# Patient Record
Sex: Female | Born: 1975 | Race: Black or African American | Hispanic: No | Marital: Single | Smoking: Never smoker
Health system: Southern US, Community
[De-identification: ages and names within clinical notes are randomized; demographics above are authoritative.]

## PROBLEM LIST (undated history)

## (undated) DIAGNOSIS — H9190 Unspecified hearing loss, unspecified ear: Secondary | ICD-10-CM

## (undated) DIAGNOSIS — Z9889 Other specified postprocedural states: Secondary | ICD-10-CM

## (undated) DIAGNOSIS — J329 Chronic sinusitis, unspecified: Secondary | ICD-10-CM

## (undated) DIAGNOSIS — R011 Cardiac murmur, unspecified: Secondary | ICD-10-CM

## (undated) DIAGNOSIS — Z86018 Personal history of other benign neoplasm: Secondary | ICD-10-CM

## (undated) DIAGNOSIS — B009 Herpesviral infection, unspecified: Secondary | ICD-10-CM

## (undated) HISTORY — DX: Herpesviral infection, unspecified: B00.9

## (undated) HISTORY — PX: UTERINE FIBROID SURGERY: SHX826

## (undated) HISTORY — DX: Personal history of other benign neoplasm: Z86.018

## (undated) HISTORY — PX: BRAIN SURGERY: SHX531

## (undated) HISTORY — DX: Cardiac murmur, unspecified: R01.1

## (undated) HISTORY — DX: Other specified postprocedural states: Z98.890

---

## 2008-02-15 DIAGNOSIS — Z86018 Personal history of other benign neoplasm: Secondary | ICD-10-CM

## 2008-02-15 DIAGNOSIS — Z9889 Other specified postprocedural states: Secondary | ICD-10-CM

## 2008-02-15 HISTORY — DX: Personal history of other benign neoplasm: Z98.890

## 2008-02-15 HISTORY — DX: Other specified postprocedural states: Z86.018

## 2014-07-12 ENCOUNTER — Ambulatory Visit (INDEPENDENT_AMBULATORY_CARE_PROVIDER_SITE_OTHER): Payer: Managed Care, Other (non HMO)

## 2014-07-12 ENCOUNTER — Ambulatory Visit (INDEPENDENT_AMBULATORY_CARE_PROVIDER_SITE_OTHER): Payer: Managed Care, Other (non HMO) | Admitting: Family Medicine

## 2014-07-12 VITALS — BP 120/70 | HR 64 | Temp 98.5°F | Resp 18 | Ht 63.0 in | Wt 152.0 lb

## 2014-07-12 DIAGNOSIS — M940 Chondrocostal junction syndrome [Tietze]: Secondary | ICD-10-CM | POA: Diagnosis not present

## 2014-07-12 DIAGNOSIS — Z86018 Personal history of other benign neoplasm: Secondary | ICD-10-CM

## 2014-07-12 DIAGNOSIS — R079 Chest pain, unspecified: Secondary | ICD-10-CM

## 2014-07-12 DIAGNOSIS — Z9889 Other specified postprocedural states: Secondary | ICD-10-CM

## 2014-07-12 MED ORDER — DICLOFENAC SODIUM 75 MG PO TBEC: 75.0000 mg | DELAYED_RELEASE_TABLET | Freq: Two times a day (BID) | ORAL | Status: DC

## 2014-07-12 NOTE — Patient Instructions (Addendum)
This is most consistent with a chest wall pain  Take diclofenac one twice daily for pain and inflammation  Return if worse  Referral is being made to a neurosurgeon to follow-up on your acoustic neuroma surgery.  Costochondritis Costochondritis, sometimes called Tietze syndrome, is a swelling and irritation (inflammation) of the tissue (cartilage) that connects your ribs with your breastbone (sternum). It causes pain in the chest and rib area. Costochondritis usually goes away on its own over time. It can take up to 6 weeks or longer to get better, especially if you are unable to limit your activities. CAUSES  Some cases of costochondritis have no known cause. Possible causes include:  Injury (trauma).  Exercise or activity such as lifting.  Severe coughing. SIGNS AND SYMPTOMS  Pain and tenderness in the chest and rib area.  Pain that gets worse when coughing or taking deep breaths.  Pain that gets worse with specific movements. DIAGNOSIS  Your health care provider will do a physical exam and ask about your symptoms. Chest X-rays or other tests may be done to rule out other problems. TREATMENT  Costochondritis usually goes away on its own over time. Your health care provider may prescribe medicine to help relieve pain. HOME CARE INSTRUCTIONS   Avoid exhausting physical activity. Try not to strain your ribs during normal activity. This would include any activities using chest, abdominal, and side muscles, especially if heavy weights are used.  Apply ice to the affected area for the first 2 days after the pain begins.  Put ice in a plastic bag.  Place a towel between your skin and the bag.  Leave the ice on for 20 minutes, 2-3 times a day.  Only take over-the-counter or prescription medicines as directed by your health care provider. SEEK MEDICAL CARE IF:  You have redness or swelling at the rib joints. These are signs of infection.  Your pain does not go away despite rest  or medicine. SEEK IMMEDIATE MEDICAL CARE IF:   Your pain increases or you are very uncomfortable.  You have shortness of breath or difficulty breathing.  You cough up blood.  You have worse chest pains, sweating, or vomiting.  You have a fever or persistent symptoms for more than 2-3 days.  You have a fever and your symptoms suddenly get worse. MAKE SURE YOU:   Understand these instructions.  Will watch your condition.  Will get help right away if you are not doing well or get worse. Document Released: 11/10/2004 Document Revised: 11/21/2012 Document Reviewed: 09/04/2012 [facility] Patient Information 2015 [place], [place]. This information is not intended to replace advice given to you by your health care provider. Make sure you discuss any questions you have with your health care provider.

## 2014-07-12 NOTE — Progress Notes (Signed)
Subjective:  Patient ID: Tina Henson, female    DOB: 04-Aug-1975  Age: 39 y.o. MRN: [id number]  39 year old lady who moved here about 6 months ago from [place], originally from the [place]. She has a history of having had substernal chest pain except and was pressing on her chest the last 2 nights. It hurt when she turned over or twisted in bed. She had not been doing any undue physical activity of late. She has a Network engineer job. No specific trauma. It bothering her enough that she was concerned about it and wanted it checked out before she made to drive to [place]. She has been having some nausea. No vomiting. She has history of uterine fibroids and pains from that. 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 She is supposed to get every 6 months MRIs, was followed by a team of doctors in [place], primarily neurosurgery and ENT/audiology.   Objective:   Pleasant alert lady in no major distress. Neck supple and nontender. Chest is clear to auscultation. Heart regular without murmurs gallops or arrhythmias. Chest wall is tender in the sternal and border areas. She also has some tenderness in the epigastrium and in the suprapubic area of the abdomen.  [facility] reading (PRIMARY) by  Dr. Linna Darner Normal chest.    EKG normal    Assessment & Plan:   Assessment: Sternal chest pain Nausea History of fibroids of uterus History of left acoustic neuroma, with residual deafness  Plan: Patient Instructions  This is most consistent with a chest wall pain  Take diclofenac one twice daily for pain and inflammation  Return if worse  Referral is being made to a neurosurgeon to follow-up on your acoustic neuroma surgery.  Costochondritis Costochondritis, sometimes called Tietze syndrome, is a swelling and irritation (inflammation) of the tissue (cartilage) that connects your ribs with your breastbone (sternum). It causes pain in the chest and rib area.  Costochondritis usually goes away on its own over time. It can take up to 6 weeks or longer to get better, especially if you are unable to limit your activities. CAUSES  Some cases of costochondritis have no known cause. Possible causes include:  Injury (trauma).  Exercise or activity such as lifting.  Severe coughing. SIGNS AND SYMPTOMS  Pain and tenderness in the chest and rib area.  Pain that gets worse when coughing or taking deep breaths.  Pain that gets worse with specific movements. DIAGNOSIS  Your health care provider will do a physical exam and ask about your symptoms. Chest X-rays or other tests may be done to rule out other problems. TREATMENT  Costochondritis usually goes away on its own over time. Your health care provider may prescribe medicine to help relieve pain. HOME CARE INSTRUCTIONS   Avoid exhausting physical activity. Try not to strain your ribs during normal activity. This would include any activities using chest, abdominal, and side muscles, especially if heavy weights are used.  Apply ice to the affected area for the first 2 days after the pain begins.  Put ice in a plastic bag.  Place a towel between your skin and the bag.  Leave the ice on for 20 minutes, 2-3 times a day.  Only take over-the-counter or prescription medicines as directed by your health care provider. SEEK MEDICAL CARE IF:  You have redness or swelling at the rib joints. These are signs of infection.  Your pain does not go away despite rest or medicine. SEEK  IMMEDIATE MEDICAL CARE IF:   Your pain increases or you are very uncomfortable.  You have shortness of breath or difficulty breathing.  You cough up blood.  You have worse chest pains, sweating, or vomiting.  You have a fever or persistent symptoms for more than 2-3 days.  You have a fever and your symptoms suddenly get worse. MAKE SURE YOU:   Understand these instructions.  Will watch your condition.  Will get  help right away if you are not doing well or get worse. Document Released: 11/10/2004 Document Revised: 11/21/2012 Document Reviewed: 09/04/2012 [facility] Patient Information 2015 [place], [place]. This information is not intended to replace advice given to you by your health care provider. Make sure you discuss any questions you have with your health care provider.      Jordy Hewins, MD 07/12/2014

## 2014-08-25 ENCOUNTER — Other Ambulatory Visit (HOSPITAL_COMMUNITY)
Admission: RE | Admit: 2014-08-25 | Discharge: 2014-08-25 | Disposition: A | Discharge status: Home / Self Care | Payer: Managed Care, Other (non HMO) | Source: Ambulatory Visit | Attending: Obstetrics & Gynecology | Admitting: Obstetrics & Gynecology

## 2014-08-25 ENCOUNTER — Other Ambulatory Visit: Payer: Self-pay | Admitting: Obstetrics & Gynecology

## 2014-08-25 DIAGNOSIS — Z113 Encounter for screening for infections with a predominantly sexual mode of transmission: Secondary | ICD-10-CM | POA: Diagnosis present

## 2014-08-25 DIAGNOSIS — Z01419 Encounter for gynecological examination (general) (routine) without abnormal findings: Secondary | ICD-10-CM | POA: Diagnosis present

## 2014-08-25 DIAGNOSIS — Z1151 Encounter for screening for human papillomavirus (HPV): Secondary | ICD-10-CM | POA: Diagnosis present

## 2014-08-26 LAB — CYTOLOGY - PAP

## 2014-10-23 DIAGNOSIS — Z86018 Personal history of other benign neoplasm: Secondary | ICD-10-CM | POA: Insufficient documentation

## 2014-10-23 DIAGNOSIS — Z9889 Other specified postprocedural states: Secondary | ICD-10-CM

## 2015-01-03 ENCOUNTER — Encounter (HOSPITAL_COMMUNITY): Payer: Self-pay | Admitting: Emergency Medicine

## 2015-01-03 ENCOUNTER — Emergency Department (HOSPITAL_COMMUNITY): Payer: Managed Care, Other (non HMO)

## 2015-01-03 ENCOUNTER — Emergency Department (HOSPITAL_COMMUNITY)
Admission: EM | Admit: 2015-01-03 | Discharge: 2015-01-03 | Disposition: A | Discharge status: Home / Self Care | Payer: Managed Care, Other (non HMO) | Attending: Emergency Medicine | Admitting: Emergency Medicine

## 2015-01-03 DIAGNOSIS — R1031 Right lower quadrant pain: Secondary | ICD-10-CM

## 2015-01-03 DIAGNOSIS — R1084 Generalized abdominal pain: Secondary | ICD-10-CM

## 2015-01-03 DIAGNOSIS — H9190 Unspecified hearing loss, unspecified ear: Secondary | ICD-10-CM | POA: Diagnosis not present

## 2015-01-03 DIAGNOSIS — Z8709 Personal history of other diseases of the respiratory system: Secondary | ICD-10-CM | POA: Diagnosis not present

## 2015-01-03 DIAGNOSIS — R112 Nausea with vomiting, unspecified: Secondary | ICD-10-CM | POA: Diagnosis not present

## 2015-01-03 DIAGNOSIS — Z3202 Encounter for pregnancy test, result negative: Secondary | ICD-10-CM | POA: Diagnosis not present

## 2015-01-03 DIAGNOSIS — R109 Unspecified abdominal pain: Secondary | ICD-10-CM | POA: Diagnosis present

## 2015-01-03 HISTORY — DX: Chronic sinusitis, unspecified: J32.9

## 2015-01-03 HISTORY — DX: Unspecified hearing loss, unspecified ear: H91.90

## 2015-01-03 LAB — URINALYSIS, ROUTINE W REFLEX MICROSCOPIC
BILIRUBIN URINE: NEGATIVE
GLUCOSE, UA: NEGATIVE mg/dL
HGB URINE DIPSTICK: NEGATIVE
KETONES UR: NEGATIVE mg/dL
Leukocytes, UA: NEGATIVE
NITRITE: NEGATIVE
PH: 5.5 (ref 5.0–8.0)
Protein, ur: NEGATIVE mg/dL
Specific Gravity, Urine: 1.018 (ref 1.005–1.030)

## 2015-01-03 LAB — BASIC METABOLIC PANEL
Anion gap: 8 (ref 5–15)
BUN: 8 mg/dL (ref 6–20)
CALCIUM: 9.1 mg/dL (ref 8.9–10.3)
CO2: 25 mmol/L (ref 22–32)
CREATININE: 0.7 mg/dL (ref 0.44–1.00)
Chloride: 107 mmol/L (ref 101–111)
GFR calc non Af Amer: >60 mL/min (ref >60)
GLUCOSE: 92 mg/dL (ref 65–99)
Potassium: 4.3 mmol/L (ref 3.5–5.1)
Sodium: 140 mmol/L (ref 135–145)

## 2015-01-03 LAB — CBC WITH DIFFERENTIAL/PLATELET
Basophils Absolute: 0 10*3/uL (ref 0.0–0.1)
Basophils Relative: 0 %
Eosinophils Absolute: 0 10*3/uL (ref 0.0–0.7)
Eosinophils Relative: 1 %
HEMATOCRIT: 37.8 % (ref 36.0–46.0)
Hemoglobin: 12.6 g/dL (ref 12.0–15.0)
LYMPHS ABS: 1.9 10*3/uL (ref 0.7–4.0)
LYMPHS PCT: 44 %
MCH: 31.3 pg (ref 26.0–34.0)
MCHC: 33.3 g/dL (ref 30.0–36.0)
MCV: 93.8 fL (ref 78.0–100.0)
MONO ABS: 0.3 10*3/uL (ref 0.1–1.0)
MONOS PCT: 7 %
NEUTROS ABS: 2.1 10*3/uL (ref 1.7–7.7)
Neutrophils Relative %: 48 %
Platelets: 204 10*3/uL (ref 150–400)
RBC: 4.03 MIL/uL (ref 3.87–5.11)
RDW: 13 % (ref 11.5–15.5)
WBC: 4.3 10*3/uL (ref 4.0–10.5)

## 2015-01-03 LAB — I-STAT BETA HCG BLOOD, ED (MC, WL, AP ONLY): I-stat hCG, quantitative: <5 m[IU]/mL (ref <5)

## 2015-01-03 MED ORDER — SODIUM CHLORIDE 0.9 % IV BOLUS (SEPSIS)
1000.0000 mL | Freq: Once | INTRAVENOUS | Status: AC
  Administered 2015-01-03: 1000 mL via INTRAVENOUS

## 2015-01-03 MED ORDER — ONDANSETRON 4 MG PO TBDP: ORAL_TABLET | ORAL | Status: DC

## 2015-01-03 NOTE — ED Notes (Signed)
Pt ambulated to BR with steady gait.

## 2015-01-03 NOTE — ED Notes (Signed)
Pt taken to CT scan and returned without distress noted.

## 2015-01-03 NOTE — ED Notes (Signed)
Awake. Verbally responsive. A/O x4. Resp even and unlabored. No audible adventitious breath sounds noted. ABC's intact. IV saline lock patent and intact. 

## 2015-01-03 NOTE — Discharge Instructions (Signed)

## 2015-01-03 NOTE — ED Notes (Signed)
Awake. Verbally responsive. A/O x4. Resp even and unlabored. No audible adventitious breath sounds noted. ABC's intact.  

## 2015-01-03 NOTE — ED Provider Notes (Signed)
CSN: [id number]     Arrival date & time 01/03/15  0740 History   First MD Initiated Contact with Patient 01/03/15 [phone]     Chief Complaint  Patient presents with  . Abdominal Pain     (Consider location/radiation/quality/duration/timing/severity/associated sxs/prior Treatment) Patient is a 39 y.o. female presenting with flank pain.  Flank Pain This is a new problem. Episode onset: 2 weeks ago. The problem occurs constantly. The problem has not changed since onset.Associated symptoms include abdominal pain. Pertinent negatives include no chest pain, no headaches and no shortness of breath. Nothing aggravates the symptoms. Nothing relieves the symptoms. She has tried nothing for the symptoms.    Past Medical History  Diagnosis Date  . Sinusitis   . Hearing loss    Past Surgical History  Procedure Laterality Date  . Brain surgery    . Uterine fibroid surgery     Family History  Problem Relation Age of Onset  . Cancer Mother   . Diabetes Mother   . Stroke Father    Social History  Substance Use Topics  . Smoking status: Never Smoker   . Smokeless tobacco: None  . Alcohol Use: 0.0 oz/week    0 Standard drinks or equivalent per week     Comment: socially   OB History    No data available     Review of Systems  Respiratory: Negative for shortness of breath.   Cardiovascular: Negative for chest pain.  Gastrointestinal: Positive for abdominal pain.  Genitourinary: Positive for flank pain.  Neurological: Negative for headaches.  All other systems reviewed and are negative.     Allergies  Review of patient's allergies indicates no known allergies.  Home Medications   Prior to Admission medications   Medication Sig Start Date End Date Taking? Authorizing Provider  aspirin 500 MG tablet Take 1,000 mg by mouth every 6 (six) hours as needed for pain.   Yes Historical Provider, MD  naproxen sodium (ANAPROX) 220 MG tablet Take 440 mg by mouth 2 (two) times daily as  needed (for pain).   Yes Historical Provider, MD  valACYclovir (VALTREX) 1000 MG tablet Take 1,000 mg by mouth daily as needed (for herpes).   Yes Historical Provider, MD  ondansetron (ZOFRAN ODT) 4 MG disintegrating tablet 4mg  ODT q4 hours prn nausea/vomit 01/03/15   Debby Freiberg, MD   BP 104/74 mmHg  Pulse 81  Temp(Src) 97.7 F (36.5 C) (Oral)  Resp 18  Ht 5\' 2"  (1.575 m)  Wt 150 lb (68.04 kg)  BMI 27.43 kg/m2  SpO2 100%  LMP 12/08/2014 Physical Exam  Constitutional: She is oriented to person, place, and time. She appears well-developed and well-nourished.  HENT:  Head: Normocephalic and atraumatic.  Right Ear: External ear normal.  Left Ear: External ear normal.  Eyes: Conjunctivae and EOM are normal. Pupils are equal, round, and reactive to light.  Neck: Normal range of motion. Neck supple.  Cardiovascular: Normal rate, regular rhythm, normal heart sounds and intact distal pulses.   Pulmonary/Chest: Effort normal and breath sounds normal.  Abdominal: Soft. Bowel sounds are normal. There is tenderness in the right upper quadrant and right lower quadrant. There is no CVA tenderness.  Musculoskeletal: Normal range of motion.  Neurological: She is alert and oriented to person, place, and time.  Skin: Skin is warm and dry.  Vitals reviewed.   ED Course  Procedures (including critical care time) Labs Review Labs Reviewed  URINALYSIS, ROUTINE W REFLEX MICROSCOPIC (NOT AT [facility]) - Abnormal;  Notable for the following:    APPearance CLOUDY (*)    All other components within normal limits  URINE CULTURE  CBC WITH DIFFERENTIAL/PLATELET  BASIC METABOLIC PANEL  I-STAT BETA HCG BLOOD, ED ([facility], [facility], [facility] ONLY)    Imaging Review Ct Abdomen Pelvis Wo Contrast  01/03/2015  CLINICAL DATA:  Pt reported having intermittent RLQ pain. Pt took an expired ABT from [place] without relief. Pt reported burning with voiding, urinary frequency/urgency, dk amber color and no foul odor or hematuria. Pt  reported constant nausea but denies vomiting. No fever. EXAM: CT ABDOMEN AND PELVIS WITHOUT CONTRAST TECHNIQUE: Multidetector CT imaging of the abdomen and pelvis was performed following the standard protocol without IV contrast. COMPARISON:  None. FINDINGS: Lung bases:  Clear.  Heart normal size. Liver: Multiple small calcifications are noted in the superior aspect of the right lobe, nonspecific. These may reflect healed granuloma. Liver otherwise unremarkable. Spleen, gallbladder, pancreas, adrenal glands:  Normal. Kidneys, ureters, bladder: Normal. No renal or ureteral stones. No hydronephrosis. Uterus and adnexa: Prominent right ovary, measuring 4.5 x 3.7 x 4.2 cm. Adnexa otherwise unremarkable. Lymph nodes:  No adenopathy. Ascites:  None. Gastrointestinal: Stomach, small bowel and colon are unremarkable. Normal appendix visualized. Abdominal wall: Small fat containing umbilical hernia. Musculoskeletal:  Normal. IMPRESSION: 1. No acute findings. 2. No renal or ureteral stones or obstructive uropathy. 3. Right ovary is mildly prominent. This is likely from a physiologic cyst. However, the patient's pain correlates to the right adnexa, follow-up pelvic ultrasound with Doppler imaging may be helpful to exclude torsion. There are no CT findings to suggest torsion, however. 4. Small calcifications along the dome of the right lobe of the liver, nonspecific. These are presumed to be a chronic incidental finding. 5. Exam otherwise unremarkable.  Normal appendix visualized. Electronically Signed   By: Lajean Manes M.D.   On: 01/03/2015 09:22   I have personally reviewed and evaluated these images and lab results as part of my medical decision-making.   EKG Interpretation None      MDM   Final diagnoses:  Generalized abdominal pain    39 y.o. female without pertinent PMH presents with R flank pain, intermittent but also constantly present x 2 weeks.  +nausea, - vomiting.  Physical exam as above with  primarily RLQ abd tenderness.  Otherwise benign exam and pt is well appearing.    Wu unremarkable.  Likely constipation mediated, however also possibly due to cyst.  Petra Kuba of history makes ovarian torsion unlikely.  DC home to fu with gyn with strict return precautions.  I have reviewed all laboratory and imaging studies if ordered as above  1. Generalized abdominal pain   2. RLQ abdominal pain         Debby Freiberg, MD 01/04/15 0700

## 2015-01-03 NOTE — ED Notes (Addendum)
Pt reported having intermittent RLQ pain. Pt took an expired ABT from [place] without relief.  Pt reported burning with voiding, urinary frequency/urgency, dk amber color and no foul odor or hematuria. Pt reported constant nausea but denies emesis. No fever.

## 2015-01-05 LAB — URINE CULTURE: Culture: 30000

## 2015-07-04 ENCOUNTER — Ambulatory Visit (INDEPENDENT_AMBULATORY_CARE_PROVIDER_SITE_OTHER): Payer: Managed Care, Other (non HMO) | Admitting: Physician Assistant

## 2015-07-04 VITALS — BP 120/78 | HR 87 | Temp 98.4°F | Resp 16 | Ht 62.5 in | Wt 158.0 lb

## 2015-07-04 DIAGNOSIS — Z113 Encounter for screening for infections with a predominantly sexual mode of transmission: Secondary | ICD-10-CM | POA: Diagnosis not present

## 2015-07-04 DIAGNOSIS — Z1159 Encounter for screening for other viral diseases: Secondary | ICD-10-CM

## 2015-07-04 DIAGNOSIS — Z1329 Encounter for screening for other suspected endocrine disorder: Secondary | ICD-10-CM | POA: Diagnosis not present

## 2015-07-04 DIAGNOSIS — Z114 Encounter for screening for human immunodeficiency virus [HIV]: Secondary | ICD-10-CM

## 2015-07-04 DIAGNOSIS — N898 Other specified noninflammatory disorders of vagina: Secondary | ICD-10-CM | POA: Diagnosis not present

## 2015-07-04 DIAGNOSIS — N926 Irregular menstruation, unspecified: Secondary | ICD-10-CM

## 2015-07-04 DIAGNOSIS — Z0000 Encounter for general adult medical examination without abnormal findings: Secondary | ICD-10-CM

## 2015-07-04 DIAGNOSIS — Z13220 Encounter for screening for lipoid disorders: Secondary | ICD-10-CM | POA: Diagnosis not present

## 2015-07-04 DIAGNOSIS — A6000 Herpesviral infection of urogenital system, unspecified: Secondary | ICD-10-CM

## 2015-07-04 DIAGNOSIS — Z13228 Encounter for screening for other metabolic disorders: Secondary | ICD-10-CM

## 2015-07-04 DIAGNOSIS — R350 Frequency of micturition: Secondary | ICD-10-CM

## 2015-07-04 DIAGNOSIS — Z1231 Encounter for screening mammogram for malignant neoplasm of breast: Secondary | ICD-10-CM | POA: Diagnosis not present

## 2015-07-04 DIAGNOSIS — Z1321 Encounter for screening for nutritional disorder: Secondary | ICD-10-CM | POA: Diagnosis not present

## 2015-07-04 LAB — COMPLETE METABOLIC PANEL WITH GFR
ALT: 14 U/L (ref 6–29)
AST: 17 U/L (ref 10–30)
Albumin: 4.4 g/dL (ref 3.6–5.1)
Alkaline Phosphatase: 43 U/L (ref 33–115)
BUN: 12 mg/dL (ref 7–25)
CALCIUM: 9.3 mg/dL (ref 8.6–10.2)
CHLORIDE: 106 mmol/L (ref 98–110)
CO2: 28 mmol/L (ref 20–31)
Creat: 0.73 mg/dL (ref 0.50–1.10)
GFR, Est Non African American: >89 mL/min (ref >=60)
Glucose, Bld: 88 mg/dL (ref 65–99)
POTASSIUM: 4.7 mmol/L (ref 3.5–5.3)
Sodium: 141 mmol/L (ref 135–146)
Total Bilirubin: 0.4 mg/dL (ref 0.2–1.2)
Total Protein: 7.1 g/dL (ref 6.1–8.1)

## 2015-07-04 LAB — CBC WITH DIFFERENTIAL/PLATELET
Basophils Absolute: 40 cells/uL (ref 0–200)
Basophils Relative: 1 %
EOS ABS: 40 {cells}/uL (ref 15–500)
Eosinophils Relative: 1 %
HEMATOCRIT: 39.8 % (ref 35.0–45.0)
Hemoglobin: 13.4 g/dL (ref 11.7–15.5)
LYMPHS PCT: 41 %
Lymphs Abs: 1640 cells/uL (ref 850–3900)
MCH: 32 pg (ref 27.0–33.0)
MCHC: 33.7 g/dL (ref 32.0–36.0)
MCV: 95 fL (ref 80.0–100.0)
MONO ABS: 280 {cells}/uL (ref 200–950)
MONOS PCT: 7 %
MPV: 10 fL (ref 7.5–12.5)
NEUTROS PCT: 50 %
Neutro Abs: 2000 cells/uL (ref 1500–7800)
PLATELETS: 275 10*3/uL (ref 140–400)
RBC: 4.19 MIL/uL (ref 3.80–5.10)
RDW: 14.4 % (ref 11.0–15.0)
WBC: 4 10*3/uL (ref 3.8–10.8)

## 2015-07-04 LAB — POC MICROSCOPIC URINALYSIS (UMFC)

## 2015-07-04 LAB — POCT URINALYSIS DIP (MANUAL ENTRY)
BILIRUBIN UA: NEGATIVE
Glucose, UA: NEGATIVE
Ketones, POC UA: NEGATIVE
LEUKOCYTES UA: NEGATIVE
NITRITE UA: NEGATIVE
PH UA: 6
Spec Grav, UA: 1.025
Urobilinogen, UA: 0.2

## 2015-07-04 LAB — POCT WET + KOH PREP
TRICH BY WET PREP: ABSENT
Yeast by KOH: ABSENT
Yeast by wet prep: ABSENT

## 2015-07-04 LAB — LIPID PANEL
CHOL/HDL RATIO: 3.5 ratio (ref <=5.0)
CHOLESTEROL: 201 mg/dL — AB (ref 125–200)
HDL: 57 mg/dL (ref >=46)
LDL Cholesterol: 136 mg/dL — ABNORMAL HIGH (ref <130)
TRIGLYCERIDES: 42 mg/dL (ref <150)
VLDL: 8 mg/dL (ref <30)

## 2015-07-04 LAB — HEPATITIS B SURFACE ANTIGEN: HEP B S AG: NEGATIVE

## 2015-07-04 LAB — HEPATITIS C ANTIBODY: HCV Ab: NEGATIVE

## 2015-07-04 LAB — TSH: TSH: 0.61 mIU/L

## 2015-07-04 MED ORDER — VALACYCLOVIR HCL 500 MG PO TABS: ORAL_TABLET | ORAL | Status: DC

## 2015-07-04 NOTE — Patient Instructions (Addendum)
Health Maintenance, Female Adopting a healthy lifestyle and getting preventive care can go a long way to promote health and wellness. Talk with your health care provider about what schedule of regular examinations is right for you. This is a good chance for you to check in with your provider about disease prevention and staying healthy. In between checkups, there are plenty of things you can do on your own. Experts have done a lot of research about which lifestyle changes and preventive measures are most likely to keep you healthy. Ask your health care provider for more information. WEIGHT AND DIET  Eat a healthy diet  Be sure to include plenty of vegetables, fruits, low-fat dairy products, and lean protein.  Do not eat a lot of foods high in solid fats, added sugars, or salt.  Get regular exercise. This is one of the most important things you can do for your health.  Most adults should exercise for at least 150 minutes each week. The exercise should increase your heart rate and make you sweat (moderate-intensity exercise).  Most adults should also do strengthening exercises at least twice a week. This is in addition to the moderate-intensity exercise.  Maintain a healthy weight  Body mass index (BMI) is a measurement that can be used to identify possible weight problems. It estimates body fat based on height and weight. Your health care provider can help determine your BMI and help you achieve or maintain a healthy weight.  For females 20 years of age and older:   A BMI below 18.5 is considered underweight.  A BMI of 18.5 to 24.9 is normal.  A BMI of 25 to 29.9 is considered overweight.  A BMI of 30 and above is considered obese.  Watch levels of cholesterol and blood lipids  You should start having your blood tested for lipids and cholesterol at 42 years of age, then have this test every 5 years.  You may need to have your cholesterol levels checked more often if:  Your lipid  or cholesterol levels are high.  You are older than 42 years of age.  You are at high risk for heart disease.  CANCER SCREENING   Lung Cancer  Lung cancer screening is recommended for adults 55-80 years old who are at high risk for lung cancer because of a history of smoking.  A yearly low-dose CT scan of the lungs is recommended for people who:  Currently smoke.  Have quit within the past 15 years.  Have at least a 30-pack-year history of smoking. A pack year is smoking an average of one pack of cigarettes a day for 1 year.  Yearly screening should continue until it has been 15 years since you quit.  Yearly screening should stop if you develop a health problem that would prevent you from having lung cancer treatment.  Breast Cancer  Practice breast self-awareness. This means understanding how your breasts normally appear and feel.  It also means doing regular breast self-exams. Let your health care provider know about any changes, no matter how small.  If you are in your 20s or 30s, you should have a clinical breast exam (CBE) by a health care provider every 1-3 years as part of a regular health exam.  If you are 40 or older, have a CBE every year. Also consider having a breast X-ray (mammogram) every year.  If you have a family history of breast cancer, talk to your health care provider about genetic screening.  If you   are at high risk for breast cancer, talk to your health care provider about having an MRI and a mammogram every year.  Breast cancer gene (BRCA) assessment is recommended for women who have family members with BRCA-related cancers. BRCA-related cancers include:  Breast.  Ovarian.  Tubal.  Peritoneal cancers.  Results of the assessment will determine the need for genetic counseling and BRCA1 and BRCA2 testing. Cervical Cancer Your health care provider may recommend that you be screened regularly for cancer of the pelvic organs (ovaries, uterus, and  vagina). This screening involves a pelvic examination, including checking for microscopic changes to the surface of your cervix (Pap test). You may be encouraged to have this screening done every 3 years, beginning at age 21.  For women ages 30-65, health care providers may recommend pelvic exams and Pap testing every 3 years, or they may recommend the Pap and pelvic exam, combined with testing for human papilloma virus (HPV), every 5 years. Some types of HPV increase your risk of cervical cancer. Testing for HPV may also be done on women of any age with unclear Pap test results.  Other health care providers may not recommend any screening for nonpregnant women who are considered low risk for pelvic cancer and who do not have symptoms. Ask your health care provider if a screening pelvic exam is right for you.  If you have had past treatment for cervical cancer or a condition that could lead to cancer, you need Pap tests and screening for cancer for at least 20 years after your treatment. If Pap tests have been discontinued, your risk factors (such as having a new sexual partner) need to be reassessed to determine if screening should resume. Some women have medical problems that increase the chance of getting cervical cancer. In these cases, your health care provider may recommend more frequent screening and Pap tests. Colorectal Cancer  This type of cancer can be detected and often prevented.  Routine colorectal cancer screening usually begins at 42 years of age and continues through 42 years of age.  Your health care provider may recommend screening at an earlier age if you have risk factors for colon cancer.  Your health care provider may also recommend using home test kits to check for hidden blood in the stool.  A small camera at the end of a tube can be used to examine your colon directly (sigmoidoscopy or colonoscopy). This is done to check for the earliest forms of colorectal  cancer.  Routine screening usually begins at age 50.  Direct examination of the colon should be repeated every 5-10 years through 42 years of age. However, you may need to be screened more often if early forms of precancerous polyps or small growths are found. Skin Cancer  Check your skin from head to toe regularly.  Tell your health care provider about any new moles or changes in moles, especially if there is a change in a mole's shape or color.  Also tell your health care provider if you have a mole that is larger than the size of a pencil eraser.  Always use sunscreen. Apply sunscreen liberally and repeatedly throughout the day.  Protect yourself by wearing long sleeves, pants, a wide-brimmed hat, and sunglasses whenever you are outside. HEART DISEASE, DIABETES, AND HIGH BLOOD PRESSURE   High blood pressure causes heart disease and increases the risk of stroke. High blood pressure is more likely to develop in:  People who have blood pressure in the high end   of the normal range (130-139/85-89 mm Hg).  People who are overweight or obese.  People who are African American.  If you are 38-23 years of age, have your blood pressure checked every 3-5 years. If you are 61 years of age or older, have your blood pressure checked every year. You should have your blood pressure measured twice--once when you are at a hospital or clinic, and once when you are not at a hospital or clinic. Record the average of the two measurements. To check your blood pressure when you are not at a hospital or clinic, you can use:  An automated blood pressure machine at a pharmacy.  A home blood pressure monitor.  If you are between 45 years and 39 years old, ask your health care provider if you should take aspirin to prevent strokes.  Have regular diabetes screenings. This involves taking a blood sample to check your fasting blood sugar level.  If you are at a normal weight and have a low risk for diabetes,  have this test once every three years after 42 years of age.  If you are overweight and have a high risk for diabetes, consider being tested at a younger age or more often. PREVENTING INFECTION  Hepatitis B  If you have a higher risk for hepatitis B, you should be screened for this virus. You are considered at high risk for hepatitis B if:  You were born in a country where hepatitis B is common. Ask your health care provider which countries are considered high risk.  Your parents were born in a high-risk country, and you have not been immunized against hepatitis B (hepatitis B vaccine).  You have HIV or AIDS.  You use needles to inject street drugs.  You live with someone who has hepatitis B.  You have had sex with someone who has hepatitis B.  You get hemodialysis treatment.  You take certain medicines for conditions, including cancer, organ transplantation, and autoimmune conditions. Hepatitis C  Blood testing is recommended for:  Everyone born from [birth year] through 1967.  Anyone with known risk factors for hepatitis C. Sexually transmitted infections (STIs)  You should be screened for sexually transmitted infections (STIs) including gonorrhea and chlamydia if:  You are sexually active and are younger than 42 years of age.  You are older than 42 years of age and your health care provider tells you that you are at risk for this type of infection.  Your sexual activity has changed since you were last screened and you are at an increased risk for chlamydia or gonorrhea. Ask your health care provider if you are at risk.  If you do not have HIV, but are at risk, it may be recommended that you take a prescription medicine daily to prevent HIV infection. This is called pre-exposure prophylaxis (PrEP). You are considered at risk if:  You are sexually active and do not regularly use condoms or know the HIV status of your partner(s).  You take drugs by injection.  You are sexually  active with a partner who has HIV. Talk with your health care provider about whether you are at high risk of being infected with HIV. If you choose to begin PrEP, you should first be tested for HIV. You should then be tested every 3 months for as long as you are taking PrEP.  PREGNANCY   If you are premenopausal and you may become pregnant, ask your health care provider about preconception counseling.  If you may  become pregnant, take 400 to 800 micrograms (mcg) of folic acid every day.  If you want to prevent pregnancy, talk to your health care provider about birth control (contraception). OSTEOPOROSIS AND MENOPAUSE   Osteoporosis is a disease in which the bones lose minerals and strength with aging. This can result in serious bone fractures. Your risk for osteoporosis can be identified using a bone density scan.  If you are 31 years of age or older, or if you are at risk for osteoporosis and fractures, ask your health care provider if you should be screened.  Ask your health care provider whether you should take a calcium or vitamin D supplement to lower your risk for osteoporosis.  Menopause may have certain physical symptoms and risks.  Hormone replacement therapy may reduce some of these symptoms and risks. Talk to your health care provider about whether hormone replacement therapy is right for you.  HOME CARE INSTRUCTIONS   Schedule regular health, dental, and eye exams.  Stay current with your immunizations.   Do not use any tobacco products including cigarettes, chewing tobacco, or electronic cigarettes.  If you are pregnant, do not drink alcohol.  If you are breastfeeding, limit how much and how often you drink alcohol.  Limit alcohol intake to no more than 1 drink per day for nonpregnant women. One drink equals 12 ounces of beer, 5 ounces of wine, or 1 ounces of hard liquor.  Do not use street drugs.  Do not share needles.  Ask your health care provider for help if  you need support or information about quitting drugs.  Tell your health care provider if you often feel depressed.  Tell your health care provider if you have ever been abused or do not feel safe at home.   This information is not intended to replace advice given to you by your health care provider. Make sure you discuss any questions you have with your health care provider.   Document Released: 08/16/2010 Document Revised: 02/21/2014 Document Reviewed: 01/02/2013 Elsevier Interactive Patient Education 2016 [employer].     IF you received an x-ray today, you will receive an invoice from [facility]. Please contact [facility] at [phone] with questions or concerns regarding your invoice.   IF you received labwork today, you will receive an invoice from [employer]. Please contact [facility] at [phone] with questions or concerns regarding your invoice.   Our billing staff will not be able to assist you with questions regarding bills from these companies.  You will be contacted with the lab results as soon as they are available. The fastest way to get your results is to activate your My Chart account. Instructions are located on the last page of this paperwork. If you have not heard from [place] regarding the results in 2 weeks, please contact this office.

## 2015-07-04 NOTE — Progress Notes (Signed)
Tina Henson  MRN: [id number] DOB: 1975-02-26  Subjective:  Pt presents to clinic for a CPE.  Burning with urination - going on for months - was seen in the ED in 12/2014 for this and she had a clear urine and normal culture- she had a CT scan at that time that showed a slight larger right ovary but otherwise was normal and no fibroids reported - she also has a sensation across her lower abd that she is having trouble explaining - nothing like she has ever felt - not really a pain but more like a tingling that is deep - this has been going on the same time frame as the urine changes  Irregular menses over the last several months - she has had them twice a month for the last 2 months - she is newly sexually active with her ex again - he lives in [place] and she goes and visits him -   Pt was told several years again [place] her hormones were lowered that expected for her age though she is unsure which hormones  Gyn - Ozan maybe? - she has not seen for these problems at her office  Last dental exam:  Every 6 months - currently has broken crown and she plans to get an appt soon Last vision exam: overdue - feels like she needs to have checked Last pap smear: 2016 with neg HPV Last mammogram: years ago in [place] Vaccinations      Tetanus - 2014     Patient Active Problem List   Diagnosis Date Noted  . Genital herpes 07/04/2015  . History of surgical procedure 10/23/2014    Current Outpatient Prescriptions on File Prior to Visit  Medication Sig Dispense Refill  . aspirin 500 MG tablet Take 1,000 mg by mouth every 6 (six) hours as needed for pain.     No current facility-administered medications on file prior to visit.    No Known Allergies  Social History   Social History  . Marital Status: Single    Spouse Name: N/A  . Number of Children: 0  . Years of Education: 65   Social History Main Topics  . Smoking status: Never Smoker   . Smokeless tobacco: None  .  Alcohol Use: 0.0 oz/week    0 Standard drinks or equivalent per week     Comment: socially - glass of wine every 2-4 weeks  . Drug Use: No  . Sexual Activity:    Partners: Male   Other Topics Concern  . None   Social History Narrative   Divorce -    No children   Born in [place] - moved to [place] and then moved to [place] in 2009   Exercise - not regularly   Works in [employer] graduate    Past Surgical History  Procedure Laterality Date  . Brain surgery    . Uterine fibroid surgery      Family History  Problem Relation Age of Onset  . Cancer Mother   . Diabetes Mother   . Stroke Father     Review of Systems  Constitutional: Negative.   HENT: Negative.   Eyes: Negative.   Respiratory: Negative.   Cardiovascular: Negative.   Gastrointestinal: Negative.   Endocrine: Negative.   Genitourinary: Positive for dysuria, vaginal discharge and menstrual problem. Negative for urgency and frequency.  Musculoskeletal: Negative.   Skin: Negative.   Allergic/Immunologic: Negative.   Neurological: Negative.   Hematological: Negative.  Psychiatric/Behavioral: Negative.    Objective:  BP 120/78 mmHg  Pulse 87  Temp(Src) 98.4 F (36.9 C) (Oral)  Resp 16  Ht 5' 2.5" (1.588 m)  Wt 158 lb (71.668 kg)  BMI 28.42 kg/m2  SpO2 98%  LMP 06/27/2015  Physical Exam  Constitutional: She is oriented to person, place, and time and well-developed, well-nourished, and in no distress.  HENT:  Head: Normocephalic and atraumatic.  Right Ear: Hearing, tympanic membrane, external ear and ear canal normal.  Left Ear: Hearing, tympanic membrane, external ear and ear canal normal.  Nose: Nose normal.  Mouth/Throat: Uvula is midline, oropharynx is clear and moist and mucous membranes are normal.  Eyes: Conjunctivae and EOM are normal. Pupils are equal, round, and reactive to light.  Neck: Trachea normal and normal range of motion. Neck supple. No thyroid mass and no thyromegaly present.    Cardiovascular: Normal rate, regular rhythm and normal heart sounds.   No murmur heard. Pulmonary/Chest: Effort normal and breath sounds normal. She has no wheezes. Right breast exhibits no inverted nipple, no mass, no nipple discharge, no skin change and no tenderness. Left breast exhibits no inverted nipple, no mass, no nipple discharge, no skin change and no tenderness. Breasts are symmetrical.  Abdominal: Soft. Bowel sounds are normal. There is no tenderness.  Genitourinary: Uterus normal, cervix normal, right adnexa normal, left adnexa normal and vulva normal. Brown and vaginal discharge (end of menses) found.  Vagina mucosa seems pale  Musculoskeletal: Normal range of motion.  Lymphadenopathy:    She has no cervical adenopathy.  Neurological: She is alert and oriented to person, place, and time. She has normal motor skills, normal sensation, normal strength and normal reflexes. Gait normal.  Skin: Skin is warm and dry.  Psychiatric: Mood, memory, affect and judgment normal.     Results for orders placed or performed in visit on 07/04/15  POCT Microscopic Urinalysis ([facility])  Result Value Ref Range   WBC,UR,HPF,POC None None WBC/hpf   RBC,UR,HPF,POC Few (A) None RBC/hpf   Bacteria Few (A) None, Too numerous to count   Mucus Present (A) Absent   Epithelial Cells, UR Per Microscopy Moderate (A) None, Too numerous to count cells/hpf  POCT urinalysis dipstick  Result Value Ref Range   Color, UA yellow yellow   Clarity, UA clear clear   Glucose, UA negative negative   Bilirubin, UA negative negative   Ketones, POC UA negative negative   Spec Grav, UA 1.025    Blood, UA trace-lysed (A) negative   pH, UA 6.0    Protein Ur, POC trace (A) negative   Urobilinogen, UA 0.2    Nitrite, UA Negative Negative   Leukocytes, UA Negative Negative  POCT Wet + KOH Prep  Result Value Ref Range   Yeast by KOH Absent Present, Absent   Yeast by wet prep Absent Present, Absent   WBC by wet prep  Few None, Few, Too numerous to count   Clue Cells Wet Prep HPF POC None None, Too numerous to count   Trich by wet prep Absent Present, Absent   Bacteria Wet Prep HPF POC Many (A) None, Few, Too numerous to count   Epithelial Cells By [employer] ([facility]) Many (A) None, Few, Too numerous to count   RBC,UR,HPF,POC None None RBC/hpf      Visual Acuity Screening   Right eye Left eye Both eyes  Without correction: 20/13 -2 20/10 -2 20/13  With correction:       Assessment and  Plan :  Annual physical exam - anticipatory guidance given   Frequency of urination - Plan: POCT Microscopic Urinalysis ([facility]), POCT urinalysis dipstick  Vaginal irritation - Plan: POCT Wet + KOH Prep - ? Some vaginal atrophy - she is still having her menses but her menses has been irregular and she was told she has low hormones for her age ? Lack f estrogen in vaginal canal causing her irrtation  Screening for HIV (human immunodeficiency virus) - Plan: HIV antibody  Need for hepatitis B screening test - Plan: Hepatitis B surface antigen  Need for hepatitis C screening test - Plan: Hepatitis C antibody  Screen for STD (sexually transmitted disease) - Plan: GC/Chlamydia Probe Amp, RPR  Screening for thyroid disorder - Plan: TSH  Screening cholesterol level - Plan: Lipid panel  Screening for metabolic disorder - Plan: COMPLETE METABOLIC PANEL WITH GFR  Irregular menses - Plan: CBC with Differential/Platelet - pt will make an appt with gyn - I think she needs a pelvic US to make sure her ovaries are healthy esp with right ovary being slight larger on 11/16 CT and the pelvic discomfort she is describing to me - we are ruling out STD due to new sexual partner within the same time frame as the dysuria  Encounter for vitamin deficiency screening - Plan: Vitamin D, 25-hydroxy  Visit for screening mammogram - Plan: MM Digital Screening  Genital herpes - Plan: valACYclovir (VALTREX) 500 MG tablet  Windell Hummingbird PA-C   [facility] 07/04/2015 12:16 PM

## 2015-07-05 LAB — HIV ANTIBODY (ROUTINE TESTING W REFLEX): HIV 1&2 Ab, 4th Generation: NONREACTIVE

## 2015-07-06 LAB — GC/CHLAMYDIA PROBE AMP
CT Probe RNA: NOT DETECTED
GC Probe RNA: NOT DETECTED

## 2015-07-06 LAB — VITAMIN D 25 HYDROXY (VIT D DEFICIENCY, FRACTURES): Vit D, 25-Hydroxy: 23 ng/mL — ABNORMAL LOW (ref 30–100)

## 2015-07-07 LAB — RPR

## 2015-09-10 ENCOUNTER — Ambulatory Visit
Admission: RE | Admit: 2015-09-10 | Discharge: 2015-09-10 | Disposition: A | Discharge status: Home / Self Care | Payer: 59 | Source: Ambulatory Visit | Attending: Physician Assistant | Admitting: Physician Assistant

## 2015-09-10 DIAGNOSIS — Z1231 Encounter for screening mammogram for malignant neoplasm of breast: Secondary | ICD-10-CM

## 2015-12-07 ENCOUNTER — Emergency Department (HOSPITAL_COMMUNITY)
Admission: EM | Admit: 2015-12-07 | Discharge: 2015-12-07 | Disposition: A | Discharge status: Home / Self Care | Payer: 59 | Attending: Emergency Medicine | Admitting: Emergency Medicine

## 2015-12-07 ENCOUNTER — Encounter (HOSPITAL_COMMUNITY): Payer: Self-pay | Admitting: *Deleted

## 2015-12-07 ENCOUNTER — Emergency Department (HOSPITAL_COMMUNITY): Payer: 59

## 2015-12-07 DIAGNOSIS — Y92410 Unspecified street and highway as the place of occurrence of the external cause: Secondary | ICD-10-CM | POA: Insufficient documentation

## 2015-12-07 DIAGNOSIS — Y939 Activity, unspecified: Secondary | ICD-10-CM | POA: Insufficient documentation

## 2015-12-07 DIAGNOSIS — Y999 Unspecified external cause status: Secondary | ICD-10-CM | POA: Diagnosis not present

## 2015-12-07 DIAGNOSIS — Z7982 Long term (current) use of aspirin: Secondary | ICD-10-CM | POA: Diagnosis not present

## 2015-12-07 DIAGNOSIS — M7918 Myalgia, other site: Secondary | ICD-10-CM

## 2015-12-07 DIAGNOSIS — M542 Cervicalgia: Secondary | ICD-10-CM | POA: Insufficient documentation

## 2015-12-07 MED ORDER — IBUPROFEN 800 MG PO TABS: 800.0000 mg | ORAL_TABLET | Freq: Three times a day (TID) | ORAL | 0 refills | Status: DC

## 2015-12-07 MED ORDER — METHOCARBAMOL 500 MG PO TABS: 500.0000 mg | ORAL_TABLET | Freq: Two times a day (BID) | ORAL | 0 refills | Status: DC

## 2015-12-07 MED ORDER — IBUPROFEN 200 MG PO TABS
600.0000 mg | ORAL_TABLET | Freq: Once | ORAL | Status: AC
  Administered 2015-12-07: 600 mg via ORAL
  Filled 2015-12-07: qty 3

## 2015-12-07 NOTE — ED Notes (Signed)
Patient transported to X-ray 
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
## 2015-12-07 NOTE — ED Notes (Signed)
Pt given discharge instructions. Pt verbalized understanding of need to follow up, reasons to return to the ED and medications to take at home for continued pain. Pt denied further questions or concerns at this time.
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
## 2015-12-09 ENCOUNTER — Emergency Department (HOSPITAL_COMMUNITY): Payer: 59

## 2015-12-09 ENCOUNTER — Emergency Department (HOSPITAL_COMMUNITY)
Admission: EM | Admit: 2015-12-09 | Discharge: 2015-12-09 | Disposition: A | Discharge status: Home / Self Care | Payer: 59 | Attending: Emergency Medicine | Admitting: Emergency Medicine

## 2015-12-09 ENCOUNTER — Encounter (HOSPITAL_COMMUNITY): Payer: Self-pay | Admitting: Emergency Medicine

## 2015-12-09 DIAGNOSIS — Y9389 Activity, other specified: Secondary | ICD-10-CM | POA: Diagnosis not present

## 2015-12-09 DIAGNOSIS — Z79899 Other long term (current) drug therapy: Secondary | ICD-10-CM | POA: Insufficient documentation

## 2015-12-09 DIAGNOSIS — M546 Pain in thoracic spine: Secondary | ICD-10-CM | POA: Diagnosis present

## 2015-12-09 DIAGNOSIS — M542 Cervicalgia: Secondary | ICD-10-CM | POA: Insufficient documentation

## 2015-12-09 DIAGNOSIS — Y999 Unspecified external cause status: Secondary | ICD-10-CM | POA: Diagnosis not present

## 2015-12-09 DIAGNOSIS — Z7982 Long term (current) use of aspirin: Secondary | ICD-10-CM | POA: Diagnosis not present

## 2015-12-09 DIAGNOSIS — Y92410 Unspecified street and highway as the place of occurrence of the external cause: Secondary | ICD-10-CM | POA: Insufficient documentation

## 2015-12-09 NOTE — Discharge Instructions (Signed)
Please read attached information. If you experience any new or worsening signs or symptoms please return to the emergency room for evaluation. Please follow-up with your primary care provider or specialist as discussed.  °
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
## 2015-12-09 NOTE — ED Notes (Signed)
Pt ambulatory in triage. 

## 2016-03-28 ENCOUNTER — Other Ambulatory Visit: Payer: Self-pay | Admitting: Family Medicine

## 2016-03-28 DIAGNOSIS — R011 Cardiac murmur, unspecified: Secondary | ICD-10-CM

## 2016-04-11 ENCOUNTER — Other Ambulatory Visit (HOSPITAL_COMMUNITY): Payer: 59

## 2016-04-22 ENCOUNTER — Ambulatory Visit (HOSPITAL_COMMUNITY): Payer: Managed Care, Other (non HMO) | Attending: Internal Medicine

## 2016-04-22 ENCOUNTER — Other Ambulatory Visit: Payer: Self-pay

## 2016-04-22 DIAGNOSIS — M549 Dorsalgia, unspecified: Secondary | ICD-10-CM | POA: Insufficient documentation

## 2016-04-22 DIAGNOSIS — I340 Nonrheumatic mitral (valve) insufficiency: Secondary | ICD-10-CM | POA: Diagnosis not present

## 2016-04-22 DIAGNOSIS — R011 Cardiac murmur, unspecified: Secondary | ICD-10-CM | POA: Diagnosis not present

## 2016-04-22 DIAGNOSIS — I341 Nonrheumatic mitral (valve) prolapse: Secondary | ICD-10-CM | POA: Diagnosis not present

## 2016-06-09 ENCOUNTER — Encounter: Payer: Self-pay | Admitting: Neurology

## 2016-06-29 ENCOUNTER — Other Ambulatory Visit: Payer: Self-pay | Admitting: Gastroenterology

## 2016-06-29 ENCOUNTER — Ambulatory Visit
Admission: RE | Admit: 2016-06-29 | Discharge: 2016-06-29 | Disposition: A | Discharge status: Home / Self Care | Payer: Managed Care, Other (non HMO) | Source: Ambulatory Visit | Attending: Gastroenterology | Admitting: Gastroenterology

## 2016-06-29 DIAGNOSIS — R109 Unspecified abdominal pain: Secondary | ICD-10-CM

## 2016-07-07 ENCOUNTER — Other Ambulatory Visit: Payer: Self-pay | Admitting: Physician Assistant

## 2016-07-07 DIAGNOSIS — A6000 Herpesviral infection of urogenital system, unspecified: Secondary | ICD-10-CM

## 2016-07-12 ENCOUNTER — Ambulatory Visit (INDEPENDENT_AMBULATORY_CARE_PROVIDER_SITE_OTHER): Payer: Managed Care, Other (non HMO) | Admitting: Neurology

## 2016-07-12 ENCOUNTER — Encounter: Payer: Self-pay | Admitting: Neurology

## 2016-07-12 VITALS — BP 100/70 | HR 92 | Ht 62.5 in | Wt 158.4 lb

## 2016-07-12 DIAGNOSIS — Z9889 Other specified postprocedural states: Secondary | ICD-10-CM

## 2016-07-12 DIAGNOSIS — Z86018 Personal history of other benign neoplasm: Secondary | ICD-10-CM | POA: Diagnosis not present

## 2016-07-12 DIAGNOSIS — R209 Unspecified disturbances of skin sensation: Secondary | ICD-10-CM

## 2016-07-12 DIAGNOSIS — R202 Paresthesia of skin: Secondary | ICD-10-CM

## 2016-07-12 DIAGNOSIS — H9203 Otalgia, bilateral: Secondary | ICD-10-CM | POA: Diagnosis not present

## 2016-07-12 NOTE — Patient Instructions (Addendum)
1.  Check blood 2.  MRI brain wwo contrast 3.  Nerve conduction study and electromyography of the right arm 4.  Please see if you can get your MRI from 2016 on CD for comparison  We will call you with the results   Return to clinic in 4 months

## 2016-07-12 NOTE — Progress Notes (Signed)
[facility] Clinic Note - Initial Visit   Date: 07/12/16  Tina Henson MRN: [id number] DOB: 09/24/1975   Dear Dr. Drema Dallas:  Thank you for your kind referral of [facility] for consultation of paresthesias. Although her history is well known to you, please allow [place] to reiterate it for the purpose of our medical record. The patient was accompanied to the clinic by self.   History of Present Illness: Tina Henson is a 41 y.o. 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    Starting about 2-3 months ago, she develop tingling over the lips, forehead, and bilateral ear achy pain. Tingling of the face is intermittent, sometime lasting 1-2 days occurring once every 2 weeks. She has not identified any triggers for facial tingling.  She denies any double vision, facial weakness, or difficulty/swallowing talking.  Her each pain is exacerbated by loud noises.  It is better with nasal spray which has helped her pain some.    She was last seen by Dr. Redmond Pulling, neurosurgery at [facility], in 2016 for left acoustic neuroma.  She had resection in 2010 while living in [place] and has residual tumor that is followed by serial imaging.  I do not have her MRI brain from 2016 to review personally, but report shows that her imaging was stable.  She has history of throbbing pain over the forehead from before her surgery which occurs about once per week and responds to OTC such as ibuprofen or aspirin.  She also complains tingling of the right 2nd and 3rd fingers, which has been constant for 1 year.  Sometimes she wakes up with her hand numbness and has to shake it at night to wake it up.  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    Out-side paper records, electronic medical record, and images  have been reviewed where available and summarized as:  MRI brain wwo contrast 12/06/2014:  Similar expansion of the left internal auditory canal with mixed signal intensity material which exhibits heterogeneous enhancement. Findings may reflect scar tissue or residual tumor, however there is no appreciable change in appearance compared to the outside MRI dated 08/28/2012.    Past Medical History:  Diagnosis Date  . Hearing loss   . Sinusitis     Past Surgical History:  Procedure Laterality Date  . BRAIN SURGERY    . UTERINE FIBROID SURGERY       Medications:  Outpatient Encounter Prescriptions as of 07/12/2016  Medication Sig  . aspirin 500 MG tablet Take 1,000 mg by mouth every 6 (six) hours as needed for pain.  Marland Kitchen ibuprofen (ADVIL,MOTRIN) 800 MG tablet Take 1 tablet (800 mg total) by mouth 3 (three) times daily.  . Oxymetazoline HCl (NASAL SPRAY NA) Place into the nose.  . valACYclovir (VALTREX) 500 MG tablet TAKE 1 TABLET DAILY FOR SUPRESSION AND INCREASE TO TWICE A DAY FOR 3 DAYS FOR OUTBREAK  . methocarbamol (ROBAXIN) 500 MG tablet Take 1 tablet (500 mg total) by mouth 2 (two) times daily. (Patient not taking: Reported on 07/12/2016)   No facility-administered encounter medications on file as of 07/12/2016.      Allergies: No Known Allergies  Family History: Family History  Problem Relation Age of Onset  . Cancer Mother   . Diabetes Mother   . Stroke Father     Social  History: Social History  Substance Use Topics  . Smoking status: Never Smoker  . Smokeless tobacco: Never Used  . Alcohol use 0.0 oz/week     Comment: socially - glass of wine every 2-4 weeks   Social History   Social History Narrative   Divorce -    No children   Born in [place] - moved to [place] as a child and then moved to [place] in 2009   Exercise - not regularly   Works in [employer] graduate    Review of Systems:  CONSTITUTIONAL: No fevers, chills, night sweats, or weight loss.   EYES:  No visual changes or eye pain ENT: + left hearing loss, +ear pain.  No history of nose bleeds.   RESPIRATORY: No cough, wheezing and shortness of breath.   CARDIOVASCULAR: Negative for chest pain, and palpitations.   GI: Negative for abdominal discomfort, blood in stools or black stools.  No recent change in bowel habits.   GU:  No history of incontinence.   MUSCLOSKELETAL: No history of joint pain or swelling.  No myalgias.   SKIN: Negative for lesions, rash, and itching.   HEMATOLOGY/ONCOLOGY: Negative for prolonged bleeding, bruising easily, and swollen nodes.  No history of cancer.   ENDOCRINE: Negative for cold or heat intolerance, polydipsia or goiter.   PSYCH:  No depression +anxiety symptoms.   NEURO: As Above.   Vital Signs:  BP 100/70   Pulse 92   Ht 5' 2.5" (1.588 m)   Wt 158 lb 7 oz (71.9 kg)   SpO2 99%   BMI 28.52 kg/m    General Medical Exam:   General:  Well appearing, comfortable.   Eyes/ENT: see cranial nerve examination.   Neck: No masses appreciated.  Full range of motion without tenderness.  No carotid bruits. Respiratory:  Clear to auscultation, good air entry bilaterally.   Cardiac:  Regular rate and rhythm, no murmur.   Extremities:  No deformities, edema, or skin discoloration.  Skin:  No rashes or lesions.  Neurological Exam: MENTAL STATUS including orientation to time, place, person, recent and remote memory, attention span and concentration, language, and fund of knowledge is normal.  Speech is not dysarthric.  CRANIAL NERVES: II:  No visual field defects.  Unremarkable fundi.   III-IV-VI: Pupils equal round and reactive to light.  Normal conjugate, extra-ocular eye movements in all directions of gaze.  No nystagmus.  No ptosis.   V:  Normal facial sensation to pin prick, temperature, light touch, and vibration.Marland Kitchen    VII:  Normal facial symmetry and movements.  No pathologic facial reflexes.  VIII:  Left hearing loss, intact on the right.  Normal  vestibular function.   IX-X:  Normal palatal movement.   XI:  Normal shoulder shrug and head rotation.   XII:  Normal tongue strength and range of motion, no deviation or fasciculation.  MOTOR:  Motor strength is 5/5 throughout including right ABP.  No atrophy, fasciculations or abnormal movements.  No pronator drift.  Tone is normal.    MSRs:  Right                                                                 Left brachioradialis 2+  brachioradialis 2+  biceps  2+  biceps 2+  triceps 2+  triceps 2+  patellar 2+  patellar 2+  ankle jerk 2+  ankle jerk 2+  Hoffman no  Hoffman no  plantar response down  plantar response down   SENSORY:  Normal and symmetric perception of light touch, pinprick, vibration, and proprioception.  Tinel's sign is negative at the wrist.  COORDINATION/GAIT: Normal finger-to- nose-finger and heel-to-shin.  Intact rapid alternating movements bilaterally. Gait narrow based and stable. Tandem and stressed gait intact.    IMPRESSION: 1.  Facial paresthesias, non-focal exam ?migraine equivalent vs vitamin deficiency 2.  Right hand parethesias, most likely due to carpal tunnel syndrome.  NCS/EMG of the right hand will be ordered  3.  History of left acoustic neuroma s/p excision (2010) with residual tumor and hearing loss.  She is due to surveillance imaging. 4.  Bilateral ear pain, unclear etiology.  If her MRI brain does not show any interval change in the size of her residual tumor, she may need to see ENT. 5.  Episodic tension headaches, occurring once per week and responsive to ibuprofen.    PLAN/RECOMMENDATIONS:  1.  Check vitamin B12, TSH, folate 2.  MRI brain wwo contrast 3.  NCS/EMG right hand 4.  Requested patient to bring CD images from her MRI brain in 2016 for comparison  Return to clinic in 4 months.   The duration of this appointment visit was 60 minutes of face-to-face time with the patient.  Greater than 50% of this time was spent in  counseling, explanation of diagnosis, planning of further management, and coordination of care.   Thank you for allowing me to participate in patient's care.  If I can answer any additional questions, I would be pleased to do so.    Sincerely,    Tori Cupps K. Posey Pronto, DO

## 2016-07-14 ENCOUNTER — Other Ambulatory Visit (INDEPENDENT_AMBULATORY_CARE_PROVIDER_SITE_OTHER): Payer: Managed Care, Other (non HMO)

## 2016-07-14 ENCOUNTER — Telehealth: Payer: Self-pay | Admitting: Neurology

## 2016-07-14 ENCOUNTER — Ambulatory Visit (INDEPENDENT_AMBULATORY_CARE_PROVIDER_SITE_OTHER): Payer: Managed Care, Other (non HMO) | Admitting: Neurology

## 2016-07-14 DIAGNOSIS — R202 Paresthesia of skin: Secondary | ICD-10-CM | POA: Diagnosis not present

## 2016-07-14 DIAGNOSIS — R209 Unspecified disturbances of skin sensation: Secondary | ICD-10-CM

## 2016-07-14 DIAGNOSIS — G5621 Lesion of ulnar nerve, right upper limb: Secondary | ICD-10-CM

## 2016-07-14 DIAGNOSIS — Z86018 Personal history of other benign neoplasm: Secondary | ICD-10-CM

## 2016-07-14 DIAGNOSIS — H9203 Otalgia, bilateral: Secondary | ICD-10-CM | POA: Diagnosis not present

## 2016-07-14 DIAGNOSIS — Z9889 Other specified postprocedural states: Secondary | ICD-10-CM | POA: Diagnosis not present

## 2016-07-14 DIAGNOSIS — G5601 Carpal tunnel syndrome, right upper limb: Secondary | ICD-10-CM

## 2016-07-14 LAB — VITAMIN B12: Vitamin B-12: 803 pg/mL (ref 211–911)

## 2016-07-14 LAB — FOLATE: Folate: >24 ng/mL (ref >5.9)

## 2016-07-14 LAB — TSH: TSH: 0.64 u[IU]/mL (ref 0.35–4.50)

## 2016-07-14 NOTE — Telephone Encounter (Signed)
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  Recommend that she start using a wrist splint at bedtime and avoid hyperflexion of the elbow/wrist to prevent nerve impingement.    Tina K. Posey Pronto, DO

## 2016-07-14 NOTE — Procedures (Signed)
[facility]  [place], [address]  [place], [place] [address] Tel: [phone] Fax:  [phone] Test Date:  07/14/2016  Patient: Tina Henson DOB: [birth year] Physician: Narda Amber, DO  Sex: Female Height: 5\' 2"  Ref Phys: Narda Amber, DO  ID#: [id number] Temp: 36.9C Technician:    Patient Complaints: This is a 41 year-old female referred for evaluation of right hand paresthesias.  NCV & EMG Findings: Extensive electrodiagnostic testing of the right upper extremity shows:  1. Right median and ulnar sensory responses are within normal limits. Right mixed palmar sensory response is abnormal.  2. Right ulnar motor response shows decreased conduction velocity across the elbow (A Elbow-B Elbow, 45 m/s).  Right median motor responses within normal limits.  3. There is no evidence of active or chronic motor axon loss changes affecting any of the tested muscles. Motor unit configuration and recruitment pattern is within normal limits.   Impression: 1. Right median neuropathy at or distal to the wrist consistent with a clinical diagnosis of carpal tunnel syndrome; very mild in degree electrically. 2. Right ulnar neuropathy across the elbow, purely demyelinating in type.   ___________________________ Narda Amber, DO    Nerve Conduction Studies Anti Sensory Summary Table   Site NR Peak (ms) Norm Peak (ms) P-T Amp (V) Norm P-T Amp  Right Median Anti Sensory (2nd Digit)  36.9C  Wrist    2.6 <3.4 40.2 >20  Right Ulnar Anti Sensory (5th Digit)  36.9C  Wrist    2.4 <3.1 27.9 >12   Motor Summary Table   Site NR Onset (ms) Norm Onset (ms) O-P Amp (mV) Norm O-P Amp Site1 Site2 Delta-0 (ms) Dist (cm) Vel (m/s) Norm Vel (m/s)  Right Median Motor (Abd Poll Brev)  36.9C  Wrist    2.4 <3.9 8.3 >6 Elbow Wrist 4.4 27.0 61 >50  Elbow    6.8  7.8         Right Ulnar Motor (Abd Dig Minimi)  36.9C  Wrist    1.9 <3.1 10.3 >7 B Elbow Wrist 3.3 23.0 70 >50  B Elbow     5.2  9.4  A Elbow B Elbow 2.2 10.0 45 >50  A Elbow    7.4  9.0          Comparison Summary Table   Site NR Peak (ms) Norm Peak (ms) P-T Amp (V) Site1 Site2 Delta-P (ms) Norm Delta (ms)  Right Median/Ulnar Palm Comparison (Wrist - 8cm)  36.9C  Median Palm    1.6 <2.2 80.0 Median Palm Ulnar Palm 0.4   Ulnar Palm    1.2 <2.2 8.3       EMG   Side Muscle Ins Act Fibs Psw Fasc Number Recrt Dur Dur. Amp Amp. Poly Poly. Comment  Right 1stDorInt Nml Nml Nml Nml Nml Nml Nml Nml Nml Nml Nml Nml N/A  Right Abd Poll Brev Nml Nml Nml Nml Nml Nml Nml Nml Nml Nml Nml Nml N/A  Right Ext Indicis Nml Nml Nml Nml Nml Nml Nml Nml Nml Nml Nml Nml N/A  Right PronatorTeres Nml Nml Nml Nml Nml Nml Nml Nml Nml Nml Nml Nml N/A  Right Biceps Nml Nml Nml Nml Nml Nml Nml Nml Nml Nml Nml Nml N/A  Right Triceps Nml Nml Nml Nml Nml Nml Nml Nml Nml Nml Nml Nml N/A  Right Deltoid Nml Nml Nml Nml Nml Nml Nml Nml Nml Nml Nml Nml N/A  Right ABD Dig Min Nml Nml Nml  Nml Nml Nml Nml Nml Nml Nml Nml Nml N/A  Right FlexCarpiUln Nml Nml Nml Nml Nml Nml Nml Nml Nml Nml Nml Nml N/A      Waveforms:

## 2016-08-09 ENCOUNTER — Ambulatory Visit
Admission: RE | Admit: 2016-08-09 | Discharge: 2016-08-09 | Disposition: A | Discharge status: Home / Self Care | Payer: Managed Care, Other (non HMO) | Source: Ambulatory Visit | Attending: Neurology | Admitting: Neurology

## 2016-08-09 DIAGNOSIS — Z9889 Other specified postprocedural states: Secondary | ICD-10-CM

## 2016-08-09 DIAGNOSIS — H9203 Otalgia, bilateral: Secondary | ICD-10-CM

## 2016-08-09 DIAGNOSIS — R202 Paresthesia of skin: Secondary | ICD-10-CM

## 2016-08-09 DIAGNOSIS — R209 Unspecified disturbances of skin sensation: Secondary | ICD-10-CM

## 2016-08-09 DIAGNOSIS — Z86018 Personal history of other benign neoplasm: Secondary | ICD-10-CM

## 2016-08-09 MED ORDER — GADOBENATE DIMEGLUMINE 529 MG/ML IV SOLN
14.0000 mL | Freq: Once | INTRAVENOUS | Status: AC | PRN
  Administered 2016-08-09: 14 mL via INTRAVENOUS

## 2016-08-12 ENCOUNTER — Telehealth: Payer: Self-pay | Admitting: Neurology

## 2016-08-12 ENCOUNTER — Ambulatory Visit: Payer: Managed Care, Other (non HMO) | Admitting: Neurology

## 2016-08-12 NOTE — Telephone Encounter (Signed)
Patient states that someone called from our office she thinks it could be for the MRI results

## 2016-08-15 NOTE — Telephone Encounter (Signed)
Did you call this patient?

## 2016-08-15 NOTE — Telephone Encounter (Signed)
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  Results were released to her on MyChart with similar message.

## 2016-08-16 ENCOUNTER — Other Ambulatory Visit: Payer: Self-pay | Admitting: *Deleted

## 2016-08-16 DIAGNOSIS — R202 Paresthesia of skin: Secondary | ICD-10-CM

## 2016-08-16 DIAGNOSIS — Z86018 Personal history of other benign neoplasm: Secondary | ICD-10-CM

## 2016-08-16 DIAGNOSIS — Z9889 Other specified postprocedural states: Secondary | ICD-10-CM

## 2016-08-16 DIAGNOSIS — H9203 Otalgia, bilateral: Secondary | ICD-10-CM

## 2016-08-16 DIAGNOSIS — R209 Unspecified disturbances of skin sensation: Principal | ICD-10-CM

## 2016-08-16 NOTE — Telephone Encounter (Signed)
Patient given results and I will send referral for 6 month repeat MRI.

## 2016-10-15 ENCOUNTER — Ambulatory Visit (INDEPENDENT_AMBULATORY_CARE_PROVIDER_SITE_OTHER): Payer: Managed Care, Other (non HMO) | Admitting: Emergency Medicine

## 2016-10-15 ENCOUNTER — Encounter: Payer: Self-pay | Admitting: Emergency Medicine

## 2016-10-15 VITALS — BP 122/78 | HR 59 | Temp 99.2°F | Resp 18 | Ht 62.5 in | Wt 160.6 lb

## 2016-10-15 DIAGNOSIS — Z0000 Encounter for general adult medical examination without abnormal findings: Secondary | ICD-10-CM | POA: Diagnosis not present

## 2016-10-15 LAB — POCT WET + KOH PREP
TRICH BY WET PREP: ABSENT
YEAST BY KOH: ABSENT
Yeast by wet prep: ABSENT

## 2016-10-15 NOTE — Progress Notes (Signed)
Tina Henson 41 y.o.   Chief Complaint  Patient presents with  . Annual Exam    With PAP smear. Pt would like to be tested for HepC.    HISTORY OF PRESENT ILLNESS: This is a 42 y.o. female here for annual exam; no complaints.  HPI   Prior to Admission medications   Medication Sig Start Date End Date Taking? Authorizing Provider  aspirin 500 MG tablet Take 1,000 mg by mouth every 6 (six) hours as needed for pain.   Yes [provider]  ibuprofen (ADVIL,MOTRIN) 800 MG tablet Take 1 tablet (800 mg total) by mouth 3 (three) times daily. 12/07/15  Yes Audry Pili, PA-C  valACYclovir (VALTREX) 500 MG tablet TAKE 1 TABLET DAILY FOR SUPRESSION AND INCREASE TO TWICE A DAY FOR 3 DAYS FOR OUTBREAK 07/08/16  Yes Weber, Sarah L, PA-C  methocarbamol (ROBAXIN) 500 MG tablet Take 1 tablet (500 mg total) by mouth 2 (two) times daily. Patient not taking: Reported on 07/12/2016 12/07/15   Audry Pili, PA-C  Oxymetazoline HCl (NASAL SPRAY NA) Place into the nose.    [provider]    No Known Allergies  Patient Active Problem List   Diagnosis Date Noted  . Genital herpes 07/04/2015  . 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 Hearing loss   . Heart murmur   . Herpes   . S/P excision of acoustic neuroma 2010  . Sinusitis     Past Surgical History:  Procedure Laterality Date  . BRAIN SURGERY    . UTERINE FIBROID SURGERY      Social History   Social History  . Marital status: Single    Spouse name: N/A  . Number of children: 0  . Years of education: 39   Occupational History  . IT    Social History Main Topics  . Smoking status: Never Smoker  . Smokeless tobacco: Never Used  . Alcohol use 0.0 oz/week     Comment: socially - glass of wine every 2-4 weeks  . Drug use: No  . Sexual activity: Yes    Partners: Male   Other Topics Concern  . Not on file   Social History Narrative   Divorce -    No  children   Born in [place] - moved to [place] as a child and then moved to [place] in 2009   Exercise - not regularly   Works in [employer], Masters   Lives in an apartment on the 3rd floor.      Family History  Problem Relation Age of Onset  . Cancer Mother   . Diabetes Mother   . Stroke Father   . Diabetes Sister      Review of Systems  Constitutional: Negative.  Negative for chills, fever and weight loss.  HENT: Negative.  Negative for congestion and nosebleeds.   Eyes: Negative.   Respiratory: Negative.   Cardiovascular: Negative.  Negative for chest pain.  Gastrointestinal: Negative.  Negative for abdominal pain, diarrhea, nausea and vomiting.  Genitourinary: Negative.   Musculoskeletal: Negative.   Skin: Negative.  Negative for rash.  Neurological: Negative.   Endo/Heme/Allergies: Negative.   Psychiatric/Behavioral: Negative.   All other systems reviewed and are negative.  Vitals:   10/15/16 1117  BP: 122/78  Pulse: (!) 59  Resp: 18  Temp: 99.2 F (37.3 C)  SpO2: 100%     Physical Exam  Constitutional: She  is oriented to person, place, and time. She appears well-developed and well-nourished.  HENT:  Head: Normocephalic and atraumatic.  Right Ear: External ear normal.  Left Ear: External ear normal.  Nose: Nose normal.  Mouth/Throat: Oropharynx is clear and moist.  Eyes: Pupils are equal, round, and reactive to light. Conjunctivae and EOM are normal.  Neck: Normal range of motion. Neck supple. No JVD present. No thyromegaly present.  Cardiovascular: Normal rate, regular rhythm, normal heart sounds and intact distal pulses.   Pulmonary/Chest: Effort normal and breath sounds normal.  Abdominal: Soft. Bowel sounds are normal. She exhibits no distension. There is no tenderness. Hernia confirmed negative in the right inguinal area and confirmed negative in the left inguinal area.  Genitourinary: Vagina normal. There is no lesion on the right labia. There  is no lesion on the left labia. Cervix exhibits no motion tenderness, no discharge and no friability. No erythema, tenderness or bleeding in the vagina. No foreign body in the vagina. No signs of injury around the vagina. No vaginal discharge found.  Musculoskeletal: Normal range of motion. She exhibits no edema or deformity.  Lymphadenopathy:    She has no cervical adenopathy.       Right: No inguinal adenopathy present.       Left: No inguinal adenopathy present.  Neurological: She is alert and oriented to person, place, and time. No sensory deficit. She exhibits normal muscle tone.  Skin: Skin is warm and dry. Capillary refill takes less than 2 seconds. No rash noted.  Psychiatric: She has a normal mood and affect. Her behavior is normal.  Vitals reviewed.    ASSESSMENT & PLAN: Tina Henson was seen today for annual exam.  Diagnoses and all orders for this visit:  Routine general medical examination at a health care facility -     CBC with Differential -     Comprehensive metabolic panel -     Cancel: GC/chlamydia probe amp, genital -     Cancel: GC/chlamydia probe amp, urine -     Hemoglobin A1c -     Lipid panel -     TSH -     Hepatitis C antibody screen -     HIV antibody -     Mammogram Digital Screening; Future -     Pap IG and Chlamydia/Gonococcus, NAA -     POCT Wet + KOH Prep -     GC/Chlamydia Probe Amp   Patient Instructions    We recommend that you schedule a mammogram for breast cancer screening. Typically, you do not need a referral to do this. Please contact a local imaging center to schedule your mammogram.  [facility] - [phone]  *ask for the Radiology Department [facility]) - [phone] or [phone]  [facility] - [phone] [facility] - [phone] [facility] - [phone]  *ask for the Radiology Department [facility] - [phone]  *ask for  the Radiology Department [facility] - [phone]  *ask for the Mammography Department [facility] - [phone]   IF you received an x-ray today, you will receive an invoice from [facility]. Please contact [facility] at [phone] with questions or concerns regarding your invoice.   IF you received labwork today, you will receive an invoice from [place]. Please contact [facility] at [phone] with questions or concerns regarding your invoice.   Our billing staff will not be able to assist you  with questions regarding bills from these companies.  You will be contacted with the lab results as soon as they are available. The fastest way to get your results is to activate your My Chart account. Instructions are located on the last page of this paperwork. If you have not heard from [place] regarding the results in 2 weeks, please contact this office.      Health Maintenance, Female Adopting a healthy lifestyle and getting preventive care can go a long way to promote health and wellness. Talk with your health care provider about what schedule of regular examinations is right for you. This is a good chance for you to check in with your provider about disease prevention and staying healthy. In between checkups, there are plenty of things you can do on your own. Experts have done a lot of research about which lifestyle changes and preventive measures are most likely to keep you healthy. Ask your health care provider for more information. Weight and diet Eat a healthy diet  Be sure to include plenty of vegetables, fruits, low-fat dairy products, and lean protein.  Do not eat a lot of foods high in solid fats, added sugars, or salt.  Get regular exercise. This is one of the most important things you can do for your health. ? Most adults should exercise for at least 150 minutes each week. The exercise should increase your heart rate and make you sweat  (moderate-intensity exercise). ? Most adults should also do strengthening exercises at least twice a week. This is in addition to the moderate-intensity exercise.  Maintain a healthy weight  Body mass index (BMI) is a measurement that can be used to identify possible weight problems. It estimates body fat based on height and weight. Your health care provider can help determine your BMI and help you achieve or maintain a healthy weight.  For females 20 years of age and older: ? A BMI below 18.5 is considered underweight. ? A BMI of 18.5 to 24.9 is normal. ? A BMI of 25 to 29.9 is considered overweight. ? A BMI of 30 and above is considered obese.  Watch levels of cholesterol and blood lipids  You should start having your blood tested for lipids and cholesterol at 43 years of age, then have this test every 5 years.  You may need to have your cholesterol levels checked more often if: ? Your lipid or cholesterol levels are high. ? You are older than 43 years of age. ? You are at high risk for heart disease.  Cancer screening Lung Cancer  Lung cancer screening is recommended for adults 83-51 years old who are at high risk for lung cancer because of a history of smoking.  A yearly low-dose CT scan of the lungs is recommended for people who: ? Currently smoke. ? Have quit within the past 15 years. ? Have at least a 30-pack-year history of smoking. A pack year is smoking an average of one pack of cigarettes a day for 1 year.  Yearly screening should continue until it has been 15 years since you quit.  Yearly screening should stop if you develop a health problem that would prevent you from having lung cancer treatment.  Breast Cancer  Practice breast self-awareness. This means understanding how your breasts normally appear and feel.  It also means doing regular breast self-exams. Let your health care provider know about any changes, no matter how small.  If you are in your 20s or  30s, you  should have a clinical breast exam (CBE) by a health care provider every 1-3 years as part of a regular health exam.  If you are 53 or older, have a CBE every year. Also consider having a breast X-ray (mammogram) every year.  If you have a family history of breast cancer, talk to your health care provider about genetic screening.  If you are at high risk for breast cancer, talk to your health care provider about having an MRI and a mammogram every year.  Breast cancer gene (BRCA) assessment is recommended for women who have family members with BRCA-related cancers. BRCA-related cancers include: ? Breast. ? Ovarian. ? Tubal. ? Peritoneal cancers.  Results of the assessment will determine the need for genetic counseling and BRCA1 and BRCA2 testing.  Cervical Cancer Your health care provider may recommend that you be screened regularly for cancer of the pelvic organs (ovaries, uterus, and vagina). This screening involves a pelvic examination, including checking for microscopic changes to the surface of your cervix (Pap test). You may be encouraged to have this screening done every 3 years, beginning at age 78.  For women ages 20-65, health care providers may recommend pelvic exams and Pap testing every 3 years, or they may recommend the Pap and pelvic exam, combined with testing for human papilloma virus (HPV), every 5 years. Some types of HPV increase your risk of cervical cancer. Testing for HPV may also be done on women of any age with unclear Pap test results.  Other health care providers may not recommend any screening for nonpregnant women who are considered low risk for pelvic cancer and who do not have symptoms. Ask your health care provider if a screening pelvic exam is right for you.  If you have had past treatment for cervical cancer or a condition that could lead to cancer, you need Pap tests and screening for cancer for at least 20 years after your treatment. If Pap tests  have been discontinued, your risk factors (such as having a new sexual partner) need to be reassessed to determine if screening should resume. Some women have medical problems that increase the chance of getting cervical cancer. In these cases, your health care provider may recommend more frequent screening and Pap tests.  Colorectal Cancer  This type of cancer can be detected and often prevented.  Routine colorectal cancer screening usually begins at 43 years of age and continues through 43 years of age.  Your health care provider may recommend screening at an earlier age if you have risk factors for colon cancer.  Your health care provider may also recommend using home test kits to check for hidden blood in the stool.  A small camera at the end of a tube can be used to examine your colon directly (sigmoidoscopy or colonoscopy). This is done to check for the earliest forms of colorectal cancer.  Routine screening usually begins at age 33.  Direct examination of the colon should be repeated every 5-10 years through 43 years of age. However, you may need to be screened more often if early forms of precancerous polyps or small growths are found.  Skin Cancer  Check your skin from head to toe regularly.  Tell your health care provider about any new moles or changes in moles, especially if there is a change in a mole's shape or color.  Also tell your health care provider if you have a mole that is larger than the size of a pencil eraser.  Always  use sunscreen. Apply sunscreen liberally and repeatedly throughout the day.  Protect yourself by wearing long sleeves, pants, a wide-brimmed hat, and sunglasses whenever you are outside.  Heart disease, diabetes, and high blood pressure  High blood pressure causes heart disease and increases the risk of stroke. High blood pressure is more likely to develop in: ? People who have blood pressure in the high end of the normal range (130-139/85-89 mm  Hg). ? People who are overweight or obese. ? People who are African American.  If you are 15-40 years of age, have your blood pressure checked every 3-5 years. If you are 32 years of age or older, have your blood pressure checked every year. You should have your blood pressure measured twice-once when you are at a hospital or clinic, and once when you are not at a hospital or clinic. Record the average of the two measurements. To check your blood pressure when you are not at a hospital or clinic, you can use: ? An automated blood pressure machine at a pharmacy. ? A home blood pressure monitor.  If you are between 41 years and 60 years old, ask your health care provider if you should take aspirin to prevent strokes.  Have regular diabetes screenings. This involves taking a blood sample to check your fasting blood sugar level. ? If you are at a normal weight and have a low risk for diabetes, have this test once every three years after 43 years of age. ? If you are overweight and have a high risk for diabetes, consider being tested at a younger age or more often. Preventing infection Hepatitis B  If you have a higher risk for hepatitis B, you should be screened for this virus. You are considered at high risk for hepatitis B if: ? You were born in a country where hepatitis B is common. Ask your health care provider which countries are considered high risk. ? Your parents were born in a high-risk country, and you have not been immunized against hepatitis B (hepatitis B vaccine). ? You have HIV or AIDS. ? You use needles to inject street drugs. ? You live with someone who has hepatitis B. ? You have had sex with someone who has hepatitis B. ? You get hemodialysis treatment. ? You take certain medicines for conditions, including cancer, organ transplantation, and autoimmune conditions.  Hepatitis C  Blood testing is recommended for: ? Everyone born from [birth year] through 1967. ? Anyone with known  risk factors for hepatitis C.  Sexually transmitted infections (STIs)  You should be screened for sexually transmitted infections (STIs) including gonorrhea and chlamydia if: ? You are sexually active and are younger than 43 years of age. ? You are older than 43 years of age and your health care provider tells you that you are at risk for this type of infection. ? Your sexual activity has changed since you were last screened and you are at an increased risk for chlamydia or gonorrhea. Ask your health care provider if you are at risk.  If you do not have HIV, but are at risk, it may be recommended that you take a prescription medicine daily to prevent HIV infection. This is called pre-exposure prophylaxis (PrEP). You are considered at risk if: ? You are sexually active and do not regularly use condoms or know the HIV status of your partner(s). ? You take drugs by injection. ? You are sexually active with a partner who has HIV.  Talk with your  health care provider about whether you are at high risk of being infected with HIV. If you choose to begin PrEP, you should first be tested for HIV. You should then be tested every 3 months for as long as you are taking PrEP. Pregnancy  If you are premenopausal and you may become pregnant, ask your health care provider about preconception counseling.  If you may become pregnant, take 400 to 800 micrograms (mcg) of folic acid every day.  If you want to prevent pregnancy, talk to your health care provider about birth control (contraception). Osteoporosis and menopause  Osteoporosis is a disease in which the bones lose minerals and strength with aging. This can result in serious bone fractures. Your risk for osteoporosis can be identified using a bone density scan.  If you are 82 years of age or older, or if you are at risk for osteoporosis and fractures, ask your health care provider if you should be screened.  Ask your health care provider whether you  should take a calcium or vitamin D supplement to lower your risk for osteoporosis.  Menopause may have certain physical symptoms and risks.  Hormone replacement therapy may reduce some of these symptoms and risks. Talk to your health care provider about whether hormone replacement therapy is right for you. Follow these instructions at home:  Schedule regular health, dental, and eye exams.  Stay current with your immunizations.  Do not use any tobacco products including cigarettes, chewing tobacco, or electronic cigarettes.  If you are pregnant, do not drink alcohol.  If you are breastfeeding, limit how much and how often you drink alcohol.  Limit alcohol intake to no more than 1 drink per day for nonpregnant women. One drink equals 12 ounces of beer, 5 ounces of wine, or 1 ounces of hard liquor.  Do not use street drugs.  Do not share needles.  Ask your health care provider for help if you need support or information about quitting drugs.  Tell your health care provider if you often feel depressed.  Tell your health care provider if you have ever been abused or do not feel safe at home. This information is not intended to replace advice given to you by your health care provider. Make sure you discuss any questions you have with your health care provider. Document Released: 08/16/2010 Document Revised: 07/09/2015 Document Reviewed: 11/04/2014 Elsevier Interactive Patient Education  2018 [place] (AHA) Exercise Recommendation  Being physically active is important to prevent heart disease and stroke, the nation's No. 1and No. 5killers. To improve overall cardiovascular health, we suggest at least 150 minutes per week of moderate exercise or 75 minutes per week of vigorous exercise (or a combination of moderate and vigorous activity). Thirty minutes a day, five times a week is an easy goal to remember. You will also experience benefits even if you  divide your time into two or three segments of 10 to 15 minutes per day.  For people who would benefit from lowering their blood pressure or cholesterol, we recommend 40 minutes of aerobic exercise of moderate to vigorous intensity three to four times a week to lower the risk for heart attack and stroke.  Physical activity is anything that makes you move your body and burn calories.  This includes things like climbing stairs or playing sports. Aerobic exercises benefit your heart, and include walking, jogging, swimming or biking. Strength and stretching exercises are best for overall stamina and flexibility.  The simplest,  positive change you can make to effectively improve your heart health is to start walking. It's enjoyable, free, easy, social and great exercise. A walking program is flexible and boasts high success rates because people can stick with it. It's easy for walking to become a regular and satisfying part of life.   For Overall Cardiovascular Health:  At least 30 minutes of moderate-intensity aerobic activity at least 5 days per week for a total of 150  OR   At least 25 minutes of vigorous aerobic activity at least 3 days per week for a total of 75 minutes; or a combination of moderate- and vigorous-intensity aerobic activity  AND   Moderate- to high-intensity muscle-strengthening activity at least 2 days per week for additional health benefits.  For Lowering Blood Pressure and Cholesterol  An average 40 minutes of moderate- to vigorous-intensity aerobic activity 3 or 4 times per week  What if I can't make it to the time goal? Something is always better than nothing! And everyone has to start somewhere. Even if you've been sedentary for years, today is the day you can begin to make healthy changes in your life. If you don't think you'll make it for 30 or 40 minutes, set a reachable goal for today. You can work up toward your overall goal by increasing your time as you get  stronger. Don't let all-or-nothing thinking rob you of doing what you can every day.  Source:http://www.heart.Burnadette Pop, MD [facility]

## 2016-10-15 NOTE — Patient Instructions (Addendum)
We recommend that you schedule a mammogram for breast cancer screening. Typically, you do not need a referral to do this. Please contact a local imaging center to schedule your mammogram.  [facility] - [phone]  *ask for the Radiology Department [facility]) - [phone] or [phone]  [facility] - [phone] [facility] - [phone] [facility] - [phone]  *ask for the Radiology Department [facility] - [phone]  *ask for the Radiology Department [facility] - [phone]  *ask for the Mammography Department [facility] - [phone]    IF you received an x-ray today, you will receive an invoice from [facility]. Please contact [facility] at [phone] with questions or concerns regarding your invoice.   IF you received labwork today, you will receive an invoice from [facility]. Please contact [facility] at [phone] with questions or concerns regarding your invoice.   Our billing staff will not be able to assist you with questions regarding bills from these companies.  You will be contacted with the lab results as soon as they are available. The fastest way to get your results is to activate your My Chart account. Instructions are located on the last page of this paperwork. If you have not heard from us regarding the results in 2 weeks, please contact this office.      Health Maintenance, Female Adopting a healthy lifestyle and getting preventive care can go a long way to promote health and wellness. Talk with your health care provider about what schedule of regular examinations is right for you. This is a good chance for you to check in with your provider about disease prevention and staying healthy. In between checkups, there are plenty of things you can do on your own. Experts have done a lot of research about which lifestyle  changes and preventive measures are most likely to keep you healthy. Ask your health care provider for more information. Weight and diet Eat a healthy diet  Be sure to include plenty of vegetables, fruits, low-fat dairy products, and lean protein.  Do not eat a lot of foods high in solid fats, added sugars, or salt.  Get regular exercise. This is one of the most important things you can do for your health. ? Most adults should exercise for at least 150 minutes each week. The exercise should increase your heart rate and make you sweat (moderate-intensity exercise). ? Most adults should also do strengthening exercises at least twice a week. This is in addition to the moderate-intensity exercise.  Maintain a healthy weight  Body mass index (BMI) is a measurement that can be used to identify possible weight problems. It estimates body fat based on height and weight. Your health care provider can help determine your BMI and help you achieve or maintain a healthy weight.  For females 20 years of age and older: ? A BMI below 18.5 is considered underweight. ? A BMI of 18.5 to 24.9 is normal. ? A BMI of 25 to 29.9 is considered overweight. ? A BMI of 30 and above is considered obese.  Watch levels of cholesterol and blood lipids  You should start having your blood tested for lipids and cholesterol at 43 years of age, then have this test every 5 years.  You may need to have your cholesterol levels checked more often if: ? Your lipid or cholesterol levels are high. ? You are older than 50   of age. ? You are at high risk for heart disease.  Cancer screening Lung Cancer  Lung cancer screening is recommended for adults 43-15 years old who are at high risk for lung cancer because of a history of smoking.  A yearly low-dose CT scan of the lungs is recommended for people who: ? Currently smoke. ? Have quit within the past 15 years. ? Have at least a 30-pack-year history of smoking. A pack  year is smoking an average of one pack of cigarettes a day for 1 year.  Yearly screening should continue until it has been 15 years since you quit.  Yearly screening should stop if you develop a health problem that would prevent you from having lung cancer treatment.  Breast Cancer  Practice breast self-awareness. This means understanding how your breasts normally appear and feel.  It also means doing regular breast self-exams. Let your health care provider know about any changes, no matter how small.  If you are in your 20s or 30s, you should have a clinical breast exam (CBE) by a health care provider every 1-3 years as part of a regular health exam.  If you are 61 or older, have a CBE every year. Also consider having a breast X-ray (mammogram) every year.  If you have a family history of breast cancer, talk to your health care provider about genetic screening.  If you are at high risk for breast cancer, talk to your health care provider about having an MRI and a mammogram every year.  Breast cancer gene (BRCA) assessment is recommended for women who have family members with BRCA-related cancers. BRCA-related cancers include: ? Breast. ? Ovarian. ? Tubal. ? Peritoneal cancers.  Results of the assessment will determine the need for genetic counseling and BRCA1 and BRCA2 testing.  Cervical Cancer Your health care provider may recommend that you be screened regularly for cancer of the pelvic organs (ovaries, uterus, and vagina). This screening involves a pelvic examination, including checking for microscopic changes to the surface of your cervix (Pap test). You may be encouraged to have this screening done every 3 years, beginning at age 51.  For women ages 13-65, health care providers may recommend pelvic exams and Pap testing every 3 years, or they may recommend the Pap and pelvic exam, combined with testing for human papilloma virus (HPV), every 5 years. Some types of HPV increase  your risk of cervical cancer. Testing for HPV may also be done on women of any age with unclear Pap test results.  Other health care providers may not recommend any screening for nonpregnant women who are considered low risk for pelvic cancer and who do not have symptoms. Ask your health care provider if a screening pelvic exam is right for you.  If you have had past treatment for cervical cancer or a condition that could lead to cancer, you need Pap tests and screening for cancer for at least 20 years after your treatment. If Pap tests have been discontinued, your risk factors (such as having a new sexual partner) need to be reassessed to determine if screening should resume. Some women have medical problems that increase the chance of getting cervical cancer. In these cases, your health care provider may recommend more frequent screening and Pap tests.  Colorectal Cancer  This type of cancer can be detected and often prevented.  Routine colorectal cancer screening usually begins at 43 years of age and continues through 43 years of age.  Your health care provider  may recommend screening at an earlier age if you have risk factors for colon cancer.  Your health care provider may also recommend using home test kits to check for hidden blood in the stool.  A small camera at the end of a tube can be used to examine your colon directly (sigmoidoscopy or colonoscopy). This is done to check for the earliest forms of colorectal cancer.  Routine screening usually begins at age 22.  Direct examination of the colon should be repeated every 5-10 years through 43 years of age. However, you may need to be screened more often if early forms of precancerous polyps or small growths are found.  Skin Cancer  Check your skin from head to toe regularly.  Tell your health care provider about any new moles or changes in moles, especially if there is a change in a mole's shape or color.  Also tell your health  care provider if you have a mole that is larger than the size of a pencil eraser.  Always use sunscreen. Apply sunscreen liberally and repeatedly throughout the day.  Protect yourself by wearing long sleeves, pants, a wide-brimmed hat, and sunglasses whenever you are outside.  Heart disease, diabetes, and high blood pressure  High blood pressure causes heart disease and increases the risk of stroke. High blood pressure is more likely to develop in: ? People who have blood pressure in the high end of the normal range (130-139/85-89 mm Hg). ? People who are overweight or obese. ? People who are African American.  If you are 3-91 years of age, have your blood pressure checked every 3-5 years. If you are 15 years of age or older, have your blood pressure checked every year. You should have your blood pressure measured twice-once when you are at a hospital or clinic, and once when you are not at a hospital or clinic. Record the average of the two measurements. To check your blood pressure when you are not at a hospital or clinic, you can use: ? An automated blood pressure machine at a pharmacy. ? A home blood pressure monitor.  If you are between 56 years and 76 years old, ask your health care provider if you should take aspirin to prevent strokes.  Have regular diabetes screenings. This involves taking a blood sample to check your fasting blood sugar level. ? If you are at a normal weight and have a low risk for diabetes, have this test once every three years after 43 years of age. ? If you are overweight and have a high risk for diabetes, consider being tested at a younger age or more often. Preventing infection Hepatitis B  If you have a higher risk for hepatitis B, you should be screened for this virus. You are considered at high risk for hepatitis B if: ? You were born in a country where hepatitis B is common. Ask your health care provider which countries are considered high risk. ? Your  parents were born in a high-risk country, and you have not been immunized against hepatitis B (hepatitis B vaccine). ? You have HIV or AIDS. ? You use needles to inject street drugs. ? You live with someone who has hepatitis B. ? You have had sex with someone who has hepatitis B. ? You get hemodialysis treatment. ? You take certain medicines for conditions, including cancer, organ transplantation, and autoimmune conditions.  Hepatitis C  Blood testing is recommended for: ? Everyone born from [birth year] through 1967. ? Anyone with  known risk factors for hepatitis C.  Sexually transmitted infections (STIs)  You should be screened for sexually transmitted infections (STIs) including gonorrhea and chlamydia if: ? You are sexually active and are younger than 43 years of age. ? You are older than 43 years of age and your health care provider tells you that you are at risk for this type of infection. ? Your sexual activity has changed since you were last screened and you are at an increased risk for chlamydia or gonorrhea. Ask your health care provider if you are at risk.  If you do not have HIV, but are at risk, it may be recommended that you take a prescription medicine daily to prevent HIV infection. This is called pre-exposure prophylaxis (PrEP). You are considered at risk if: ? You are sexually active and do not regularly use condoms or know the HIV status of your partner(s). ? You take drugs by injection. ? You are sexually active with a partner who has HIV.  Talk with your health care provider about whether you are at high risk of being infected with HIV. If you choose to begin PrEP, you should first be tested for HIV. You should then be tested every 3 months for as long as you are taking PrEP. Pregnancy  If you are premenopausal and you may become pregnant, ask your health care provider about preconception counseling.  If you may become pregnant, take 400 to 800 micrograms (mcg) of folic  acid every day.  If you want to prevent pregnancy, talk to your health care provider about birth control (contraception). Osteoporosis and menopause  Osteoporosis is a disease in which the bones lose minerals and strength with aging. This can result in serious bone fractures. Your risk for osteoporosis can be identified using a bone density scan.  If you are 65 years of age or older, or if you are at risk for osteoporosis and fractures, ask your health care provider if you should be screened.  Ask your health care provider whether you should take a calcium or vitamin D supplement to lower your risk for osteoporosis.  Menopause may have certain physical symptoms and risks.  Hormone replacement therapy may reduce some of these symptoms and risks. Talk to your health care provider about whether hormone replacement therapy is right for you. Follow these instructions at home:  Schedule regular health, dental, and eye exams.  Stay current with your immunizations.  Do not use any tobacco products including cigarettes, chewing tobacco, or electronic cigarettes.  If you are pregnant, do not drink alcohol.  If you are breastfeeding, limit how much and how often you drink alcohol.  Limit alcohol intake to no more than 1 drink per day for nonpregnant women. One drink equals 12 ounces of beer, 5 ounces of wine, or 1 ounces of hard liquor.  Do not use street drugs.  Do not share needles.  Ask your health care provider for help if you need support or information about quitting drugs.  Tell your health care provider if you often feel depressed.  Tell your health care provider if you have ever been abused or do not feel safe at home. This information is not intended to replace advice given to you by your health care provider. Make sure you discuss any questions you have with your health care provider. Document Released: 08/16/2010 Document Revised: 07/09/2015 Document Reviewed:  11/04/2014 Elsevier Interactive Patient Education  2018 [employer]  American Heart Association (AHA) Exercise Recommendation  Being   physically active is important to prevent heart disease and stroke, the nation's No. 1and No. 5killers. To improve overall cardiovascular health, we suggest at least 150 minutes per week of moderate exercise or 75 minutes per week of vigorous exercise (or a combination of moderate and vigorous activity). Thirty minutes a day, five times a week is an easy goal to remember. You will also experience benefits even if you divide your time into two or three segments of 10 to 15 minutes per day.  For people who would benefit from lowering their blood pressure or cholesterol, we recommend 40 minutes of aerobic exercise of moderate to vigorous intensity three to four times a week to lower the risk for heart attack and stroke.  Physical activity is anything that makes you move your body and burn calories.  This includes things like climbing stairs or playing sports. Aerobic exercises benefit your heart, and include walking, jogging, swimming or biking. Strength and stretching exercises are best for overall stamina and flexibility.  The simplest, positive change you can make to effectively improve your heart health is to start walking. It's enjoyable, free, easy, social and great exercise. A walking program is flexible and boasts high success rates because people can stick with it. It's easy for walking to become a regular and satisfying part of life.   For Overall Cardiovascular Health:  At least 30 minutes of moderate-intensity aerobic activity at least 5 days per week for a total of 150  OR   At least 25 minutes of vigorous aerobic activity at least 3 days per week for a total of 75 minutes; or a combination of moderate- and vigorous-intensity aerobic activity  AND   Moderate- to high-intensity muscle-strengthening activity at least 2 days per week for additional  health benefits.  For Lowering Blood Pressure and Cholesterol  An average 40 minutes of moderate- to vigorous-intensity aerobic activity 3 or 4 times per week  What if I can't make it to the time goal? Something is always better than nothing! And everyone has to start somewhere. Even if you've been sedentary for years, today is the day you can begin to make healthy changes in your life. If you don't think you'll make it for 30 or 40 minutes, set a reachable goal for today. You can work up toward your overall goal by increasing your time as you get stronger. Don't let all-or-nothing thinking rob you of doing what you can every day.  Source:http://www.heart.org

## 2016-10-18 LAB — CBC WITH DIFFERENTIAL/PLATELET
BASOS ABS: 0 10*3/uL (ref 0.0–0.2)
Basos: 0 %
EOS (ABSOLUTE): 0 10*3/uL (ref 0.0–0.4)
Eos: 0 %
HEMOGLOBIN: 12.6 g/dL (ref 11.1–15.9)
Hematocrit: 38.5 % (ref 34.0–46.6)
IMMATURE GRANS (ABS): 0 10*3/uL (ref 0.0–0.1)
IMMATURE GRANULOCYTES: 0 %
LYMPHS: 50 %
Lymphocytes Absolute: 2.2 10*3/uL (ref 0.7–3.1)
MCH: 30.5 pg (ref 26.6–33.0)
MCHC: 32.7 g/dL (ref 31.5–35.7)
MCV: 93 fL (ref 79–97)
Monocytes Absolute: 0.2 10*3/uL (ref 0.1–0.9)
Monocytes: 4 %
NEUTROS PCT: 46 %
Neutrophils Absolute: 2.1 10*3/uL (ref 1.4–7.0)
PLATELETS: 224 10*3/uL (ref 150–379)
RBC: 4.13 x10E6/uL (ref 3.77–5.28)
RDW: 14.9 % (ref 12.3–15.4)
WBC: 4.5 10*3/uL (ref 3.4–10.8)

## 2016-10-18 LAB — LIPID PANEL
CHOLESTEROL TOTAL: 184 mg/dL (ref 100–199)
Chol/HDL Ratio: 3.7 ratio (ref 0.0–4.4)
HDL: 50 mg/dL (ref >39)
LDL Calculated: 118 mg/dL — ABNORMAL HIGH (ref 0–99)
Triglycerides: 78 mg/dL (ref 0–149)
VLDL Cholesterol Cal: 16 mg/dL (ref 5–40)

## 2016-10-18 LAB — COMPREHENSIVE METABOLIC PANEL
ALBUMIN: 4.5 g/dL (ref 3.5–5.5)
ALT: 14 IU/L (ref 0–32)
AST: 18 IU/L (ref 0–40)
Albumin/Globulin Ratio: 1.7 (ref 1.2–2.2)
Alkaline Phosphatase: 52 IU/L (ref 39–117)
BILIRUBIN TOTAL: 0.3 mg/dL (ref 0.0–1.2)
BUN/Creatinine Ratio: 9 (ref 9–23)
BUN: 7 mg/dL (ref 6–24)
CO2: 24 mmol/L (ref 20–29)
CREATININE: 0.74 mg/dL (ref 0.57–1.00)
Calcium: 9.4 mg/dL (ref 8.7–10.2)
Chloride: 103 mmol/L (ref 96–106)
GFR calc non Af Amer: 100 mL/min/{1.73_m2} (ref >59)
GFR, EST AFRICAN AMERICAN: 115 mL/min/{1.73_m2} (ref >59)
GLUCOSE: 87 mg/dL (ref 65–99)
Globulin, Total: 2.7 g/dL (ref 1.5–4.5)
Potassium: 4.3 mmol/L (ref 3.5–5.2)
Sodium: 140 mmol/L (ref 134–144)
TOTAL PROTEIN: 7.2 g/dL (ref 6.0–8.5)

## 2016-10-18 LAB — HEMOGLOBIN A1C
Est. average glucose Bld gHb Est-mCnc: 111 mg/dL
HEMOGLOBIN A1C: 5.5 % (ref 4.8–5.6)

## 2016-10-18 LAB — GC/CHLAMYDIA PROBE AMP
CHLAMYDIA, DNA PROBE: NEGATIVE
Neisseria gonorrhoeae by PCR: NEGATIVE

## 2016-10-18 LAB — TSH: TSH: 0.955 u[IU]/mL (ref 0.450–4.500)

## 2016-10-18 LAB — HIV ANTIBODY (ROUTINE TESTING W REFLEX): HIV SCREEN 4TH GENERATION: NONREACTIVE

## 2016-10-18 LAB — HEPATITIS C ANTIBODY: HEP C VIRUS AB: 0.1 {s_co_ratio} (ref 0.0–0.9)

## 2016-10-19 LAB — PAP IG AND CT-NG NAA
CHLAMYDIA, NUC. ACID AMP: NEGATIVE
Gonococcus by Nucleic Acid Amp: NEGATIVE
PAP Smear Comment: 0

## 2016-10-21 ENCOUNTER — Telehealth: Payer: Self-pay | Admitting: Emergency Medicine

## 2016-10-21 NOTE — Telephone Encounter (Signed)
Pt is calling to see if she needs an antibiotic for her infection.  She is also trying to find her Hep B test on her MyChart as well but she does not see them.  Please fax results to PCP office. [phone]

## 2016-10-21 NOTE — Telephone Encounter (Signed)
Patient misread KOH results. Assumed she had qa bacteria infection. Assures results are negative Also, states she expressed to provider and CMA she wanted testing done for Hep B and C.   Advised, to return to clinic for lab only visit and it will be a charge.

## 2016-11-23 ENCOUNTER — Ambulatory Visit (INDEPENDENT_AMBULATORY_CARE_PROVIDER_SITE_OTHER): Payer: Managed Care, Other (non HMO) | Admitting: Neurology

## 2016-11-23 ENCOUNTER — Encounter: Payer: Self-pay | Admitting: Neurology

## 2016-11-23 VITALS — BP 100/70 | HR 95 | Ht 62.5 in | Wt 161.1 lb

## 2016-11-23 DIAGNOSIS — G5621 Lesion of ulnar nerve, right upper limb: Secondary | ICD-10-CM | POA: Diagnosis not present

## 2016-11-23 DIAGNOSIS — H9201 Otalgia, right ear: Secondary | ICD-10-CM | POA: Diagnosis not present

## 2016-11-23 DIAGNOSIS — G44219 Episodic tension-type headache, not intractable: Secondary | ICD-10-CM | POA: Diagnosis not present

## 2016-11-23 DIAGNOSIS — Z86018 Personal history of other benign neoplasm: Secondary | ICD-10-CM | POA: Diagnosis not present

## 2016-11-23 DIAGNOSIS — G5601 Carpal tunnel syndrome, right upper limb: Secondary | ICD-10-CM

## 2016-11-23 DIAGNOSIS — Z9889 Other specified postprocedural states: Secondary | ICD-10-CM

## 2016-11-23 DIAGNOSIS — M25511 Pain in right shoulder: Secondary | ICD-10-CM | POA: Diagnosis not present

## 2016-11-23 MED ORDER — MELOXICAM 15 MG PO TABS: 15.0000 mg | ORAL_TABLET | Freq: Every day | ORAL | 0 refills | Status: DC

## 2016-11-23 NOTE — Patient Instructions (Signed)
1.  Start Mobic 15mg  daily for right shoulder pain.  If no improvement, then follow-up with your primary care doctor 2.  Continue to use wrist splint and elbow pad 3.  Referral to ENT for right ear pain and left tinnitus  Return to clinic in 6 months

## 2016-11-23 NOTE — Progress Notes (Signed)
Follow-up Visit   Date: 11/23/16    Rhesa Forsberg MRN: [id number] DOB: Jul 13, 1975   Interim History: [place] Reality Dejonge is a 41 y.o. 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 She also has new complains of right shoulder pain. The patient was accompanied to the clinic by self.  History of present illness: Starting around the Spring of 2018, she develop tingling over the lips, forehead, and bilateral ear achy pain. Tingling of the face is intermittent, sometime lasting 1-2 days occurring once every 2 weeks. She has not identified any triggers for facial tingling.  She denies any double vision, facial weakness, or difficulty/swallowing talking.  Her each pain is exacerbated by loud noises.  It is better with nasal spray which has helped her pain some.    She was last seen by Dr. Redmond Pulling, neurosurgery at [facility], in 2016 for left acoustic neuroma.  She had resection in 2010 while living in [place] and has residual tumor that is followed by serial imaging.  I do not have her MRI brain from 2016 to review personally, but report shows that her imaging was stable.  She has history of throbbing pain over the forehead from before her surgery which occurs about once per week and responds to OTC such as ibuprofen or aspirin.  She also complains tingling of the right 2nd and 3rd fingers, which has been constant for 1 year.  Sometimes she wakes up with her hand numbness and has to shake it at night to wake it up.  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    UPDATE 11/23/2016:  For the past two weeks, she has noticed achy pain of the shoulder and reduced ROM of the shoulder, worse with arm extension or internal rotation, such as when putting on her bra.  She has not tried any NSAIDs or tylenol. There is no  associated tingling of the same region and no neck pain.  Headaches are less frequent and contnue to respond to tylenol, always triggered by stress.  She has not had any further spells of facial tingling.   She continues to have right hand paresthesias which as improved when using wrist splint, but admits to only using this for a few weeks.  She continues to be bothered by her right ear pain and left tinnitus and hearing loss and would like to see ENT.  In the past, cochlear implant was recommended, but she did not want to pursue this.  Medications:  Current Outpatient Prescriptions on File Prior to Visit  Medication Sig Dispense Refill  . aspirin 500 MG tablet Take 1,000 mg by mouth every 6 (six) hours as needed for pain.    Marland Kitchen ibuprofen (ADVIL,MOTRIN) 800 MG tablet Take 1 tablet (800 mg total) by mouth 3 (three) times daily. 21 tablet 0  . Oxymetazoline HCl (NASAL SPRAY NA) Place into the nose.    . valACYclovir (VALTREX) 500 MG tablet TAKE 1 TABLET DAILY FOR SUPRESSION AND INCREASE TO TWICE A DAY FOR 3 DAYS FOR OUTBREAK 30 tablet 0  . methocarbamol (ROBAXIN) 500 MG tablet Take 1 tablet (500 mg total) by mouth 2 (two) times daily. (Patient not taking: Reported on 07/12/2016) 20 tablet 0   No current facility-administered medications on file prior to visit.     Allergies: No Known Allergies  Review of  Systems:  CONSTITUTIONAL: No fevers, chills, night sweats, or weight loss.  EYES: No visual changes or eye pain ENT: No hearing changes.  No history of nose bleeds.   RESPIRATORY: No cough, wheezing and shortness of breath.   CARDIOVASCULAR: Negative for chest pain, and palpitations.   GI: Negative for abdominal discomfort, blood in stools or black stools.  No recent change in bowel habits.   GU:  No history of incontinence.   MUSCLOSKELETAL: +history of joint pain or swelling.  No myalgias.   SKIN: Negative for lesions, rash, and itching.   ENDOCRINE: Negative for cold or heat intolerance,  polydipsia or goiter.   PSYCH:  No depression or anxiety symptoms.   NEURO: As Above.   Vital Signs:  BP 100/70   Pulse 95   Ht 5' 2.5" (1.588 m)   Wt 161 lb 2 oz (73.1 kg)   SpO2 93%   BMI 29.00 kg/m    General: Well appearing, comfortable  Neurological Exam: MENTAL STATUS including orientation to time, place, person, recent and remote memory, attention span and concentration, language, and fund of knowledge is normal.  Speech is not dysarthric.  CRANIAL NERVES:  Pupils equal round and reactive to light.  Normal conjugate, extra-ocular eye movements in all directions of gaze.  No ptosis. Normal facial sensation.  Face is symmetric. Palate elevates symmetrically.  Tongue is midline.  MOTOR:  Motor strength is 5/5 in all extremities.  There is reduced ROM of the right shoulder in arm extension and abduction and pain at end-point.  Tone is normal.    MSRs:  Reflexes are 2+/4 throughout.  SENSORY:  Intact to vibration throughout.  COORDINATION/GAIT:  Normal finger-to- nose-finger.  Gait narrow based and stable.   Data: MRI brain wwo contrast 12/06/2014:  Similar expansion of the left internal auditory canal with mixed signal intensity material which exhibits heterogeneous enhancement. Findings may reflect scar tissue or residual tumor, however there is no appreciable change in appearance compared to the outside MRI dated 08/28/2012.  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 Lacy enhancement within the left internal auditory canal may represent postop enhancement. As there are no prior studies, follow-up MRI suggested to evaluate for interval changes. Residual or recurrent tumor cannot be excluded based on this single examination.  NCS/EMG of the right upper extremity 07/14/2016: 1. Right median neuropathy at or distal to the wrist consistent with a clinical diagnosis of carpal tunnel syndrome; very mild in degree electrically. 2. Right ulnar  neuropathy across the elbow, purely demyelinating in type.  Lab Results  Component Value Date   TSH 0.955 10/15/2016   Lab Results  Component Value Date   FOLATE >24.0 07/14/2016   Lab Results  Component Value Date   [id number] 914 07/14/2016    IMPRESSION/PLAN: 1.  Right carpal tunnel syndrome, encouraged compliance with wrist splint 2.  Right ulnar neuropathy, continue to use soft elbow pad and avoid hyperflexion of the elbow 3.  Right shoulder pain, musculoskeletal.  Start Mobic 15mg  daily as needed.  4.  Episodic tension headaches have improved in frequency, occurring once every 2 weeks and continue to respond to tylenol 5.  History of left acoustic neuroma s/p excision (2010) with residual tumor and hearing loss.  Recent imaging shows lacy enhancement in the left IAC (?residual tumor), which per report from her study at [facility] appears stable.  Repeat in 6 months. 6.  Facial paresthesias - resolved 7.  Right ear pain  and left tinnitus.  Patient requesting referral to ENT.  Return to clinic in 6 months  The duration of this appointment visit was 35 minutes of face-to-face time with the patient.  Greater than 50% of this time was spent in counseling, explanation of diagnosis, planning of further management, and coordination of care.   Thank you for allowing me to participate in patient's care.  If I can answer any additional questions, I would be pleased to do so.    Sincerely,    Donika K. Posey Pronto, DO

## 2017-04-21 ENCOUNTER — Telehealth: Payer: Self-pay | Admitting: Neurology

## 2017-04-21 NOTE — Telephone Encounter (Signed)
EMG report faxed.

## 2017-04-21 NOTE — Telephone Encounter (Signed)
Tina Henson with Dr Nelva Bush office called and wanted a copy of pt's emg results sent to them CB# [phone]

## 2017-05-19 ENCOUNTER — Other Ambulatory Visit: Payer: Self-pay | Admitting: Family Medicine

## 2017-05-19 DIAGNOSIS — Z1231 Encounter for screening mammogram for malignant neoplasm of breast: Secondary | ICD-10-CM

## 2017-05-22 ENCOUNTER — Ambulatory Visit
Admission: RE | Admit: 2017-05-22 | Discharge: 2017-05-22 | Disposition: A | Discharge status: Home / Self Care | Payer: Managed Care, Other (non HMO) | Source: Ambulatory Visit | Attending: Family Medicine | Admitting: Family Medicine

## 2017-05-22 DIAGNOSIS — Z1231 Encounter for screening mammogram for malignant neoplasm of breast: Secondary | ICD-10-CM

## 2017-05-23 NOTE — Progress Notes (Signed)
Follow-up Visit   Date: 05/24/17    Tina Henson MRN: [id number] DOB: [birth year]   Interim History: Tina Henson is a 42 y.o. 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 The patient was accompanied to the clinic by self.  History of present illness: Starting around the Spring of 2018, she develop tingling over the lips, forehead, and bilateral ear achy pain. Tingling of the face is intermittent, sometime lasting 1-2 days occurring once every 2 weeks. She has not identified any triggers for facial tingling.  She denies any double vision, facial weakness, or difficulty/swallowing talking.  Her each pain is exacerbated by loud noises.  It is better with nasal spray which has helped her pain some.    She was last seen by Dr. Redmond Pulling, neurosurgery at [facility], in 2016 for left acoustic neuroma.  She had resection in 2010 while living in [place] and has residual tumor that is followed by serial imaging.  I do not have her MRI brain from 2016 to review personally, but report shows that her imaging was stable.  She has history of throbbing pain over the forehead from before her surgery which occurs about once per week and responds to OTC such as ibuprofen or aspirin.  She also complains tingling of the right 2nd and 3rd fingers, which has been constant for 1 year.  Sometimes she wakes up with her hand numbness and has to shake it at night to wake it up.  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    UPDATE 11/23/2016:  For the past two weeks, she has noticed achy pain of the shoulder and reduced ROM of the shoulder, worse with arm extension or internal rotation, such as when putting on her bra.  She has not tried any NSAIDs or tylenol. There is no associated tingling of the same region and no neck pain.  Headaches are  less frequent and contnue to respond to tylenol, always triggered by stress.  She has not had any further spells of facial tingling.   She continues to have right hand paresthesias which as improved when using wrist splint, but admits to only using this for a few weeks.  She continues to be bothered by her right ear pain and left tinnitus and hearing loss and would like to see ENT.  In the past, cochlear implant was recommended, but she did not want to pursue this.  UPDATE 05/23/2017:  She saw orthopeadics, Dr. Cynda Acres, for her neck and shoulder pain, which is worse when reaching for her back, such as putting on her bra.  MRI cervical spine did not show any nerve impingement, per patient's reports.  She will be starting physical therapy and offered steroid injections, which she is undecided on.  She has been noncompliant with using her wrist splint and recently starting using it again.  She also complains of burning/hot sensation over the lateral elbow, which does not radiate.  No arm or hand weakness.  Headaches are doing much better.    Medications:  Current Outpatient Medications on File Prior to Visit  Medication Sig Dispense Refill  . aspirin 500 MG tablet Take 1,000 mg by mouth every 6 (six) hours as needed for pain.    . diazepam (VALIUM) 10 MG tablet INSERT 1 TABLET VAGINALLY AT BEDTIME  2  . ibuprofen (ADVIL,MOTRIN)  800 MG tablet Take 1 tablet (800 mg total) by mouth 3 (three) times daily. 21 tablet 0  . meloxicam (MOBIC) 15 MG tablet Take 1 tablet (15 mg total) by mouth daily. 15 tablet 0  . methocarbamol (ROBAXIN) 500 MG tablet Take 1 tablet (500 mg total) by mouth 2 (two) times daily. 20 tablet 0  . MYRBETRIQ 50 MG TB24 tablet Take 50 mg by mouth daily.  1  . Oxymetazoline HCl (NASAL SPRAY NA) Place into the nose.    . valACYclovir (VALTREX) 500 MG tablet TAKE 1 TABLET DAILY FOR SUPRESSION AND INCREASE TO TWICE A DAY FOR 3 DAYS FOR OUTBREAK 30 tablet 0   No current  facility-administered medications on file prior to visit.     Allergies: No Known Allergies  Review of Systems:  CONSTITUTIONAL: No fevers, chills, night sweats, or weight loss.  EYES: No visual changes or eye pain ENT: No hearing changes.  No history of nose bleeds.   RESPIRATORY: No cough, wheezing and shortness of breath.   CARDIOVASCULAR: Negative for chest pain, and palpitations.   GI: Negative for abdominal discomfort, blood in stools or black stools.  No recent change in bowel habits.   GU:  No history of incontinence.   MUSCLOSKELETAL: +history of joint pain or swelling.  No myalgias.   SKIN: Negative for lesions, rash, and itching.   ENDOCRINE: Negative for cold or heat intolerance, polydipsia or goiter.   PSYCH:  No depression or anxiety symptoms.   NEURO: As Above.   Vital Signs:  BP 100/70   Pulse 82   Ht 5' 2.5" (1.588 m)   Wt 164 lb 4 oz (74.5 kg)   LMP 05/13/2017   SpO2 99%   BMI 29.56 kg/m    General Medical Exam:   General:  Well appearing, comfortable  Neck:   No carotid bruits. Respiratory:  Clear to auscultation, good air entry bilaterally.   Cardiac:  Regular rate and rhythm, no murmur.   Ext:  Right shoulder tenderness and pain with ROM testing, especially internal rotation  Neurological Exam: MENTAL STATUS including orientation to time, place, person, recent and remote memory, attention span and concentration, language, and fund of knowledge is normal.  Speech is not dysarthric.  CRANIAL NERVES:  Pupils equal round and reactive to light.  Normal conjugate, extra-ocular eye movements in all directions of gaze.  No ptosis. Face is symmetric. Reduced finger rub on the left.  MOTOR:  Motor strength is 5/5 in all extremities.  There is reduced ROM of the right shoulder in arm extension and internal rotation  MSRs:  Reflexes are 2+/4 throughout.  SENSORY:  Intact to vibration throughout and temperature.  COORDINATION/GAIT:    Gait narrow based and  stable.   Data: MRI brain wwo contrast 12/06/2014:  Similar expansion of the left internal auditory canal with mixed signal intensity material which exhibits heterogeneous enhancement. Findings may reflect scar tissue or residual tumor, however there is no appreciable change in appearance compared to the outside MRI dated 08/28/2012.  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 Lacy enhancement within the left internal auditory canal may represent postop enhancement. As there are no prior studies, follow-up MRI suggested to evaluate for interval changes. Residual or recurrent tumor cannot be excluded based on this single examination.  NCS/EMG of the right upper extremity 07/14/2016: 1. Right median neuropathy at or distal to the wrist consistent with a clinical diagnosis of carpal tunnel syndrome; very mild  in degree electrically. 2. Right ulnar neuropathy across the elbow, purely demyelinating in type.  Lab Results  Component Value Date   TSH 0.955 10/15/2016   Lab Results  Component Value Date   FOLATE >24.0 07/14/2016   Lab Results  Component Value Date   [id number] 750 07/14/2016    IMPRESSION/PLAN: 1.  Episodic tension headaches - improved, occurring infrequently and responsive to tylenol 2.  History of left acoustic neuroma s/p excision (2010) with residual tumor and hearing loss.  Imaging shows lacy enhancement in the left IAC (?residual tumor), which per report from her study at [facility] appears stable.  She is due fur annual surveillance with MRI brain wwo contrast 3.  Right carpal tunnel syndrome, very mild.  Encouraged compliance with wrist splint 4.  Right ulnar neuropathy, improved. 5.  Right shoulder pain is followed by orthopeadics.   Return to clinic in 6 months  Thank you for allowing me to participate in patient's care.  If I can answer any additional questions, I would be pleased to do so.    Sincerely,    Donika K.  Posey Pronto, DO

## 2017-05-24 ENCOUNTER — Ambulatory Visit: Payer: Managed Care, Other (non HMO) | Admitting: Neurology

## 2017-05-24 ENCOUNTER — Encounter: Payer: Self-pay | Admitting: Neurology

## 2017-05-24 VITALS — BP 100/70 | HR 82 | Ht 62.5 in | Wt 164.2 lb

## 2017-05-24 DIAGNOSIS — G5621 Lesion of ulnar nerve, right upper limb: Secondary | ICD-10-CM | POA: Diagnosis not present

## 2017-05-24 DIAGNOSIS — G44219 Episodic tension-type headache, not intractable: Secondary | ICD-10-CM | POA: Diagnosis not present

## 2017-05-24 DIAGNOSIS — G5601 Carpal tunnel syndrome, right upper limb: Secondary | ICD-10-CM | POA: Diagnosis not present

## 2017-05-24 DIAGNOSIS — Z9889 Other specified postprocedural states: Secondary | ICD-10-CM | POA: Diagnosis not present

## 2017-05-24 DIAGNOSIS — Z86018 Personal history of other benign neoplasm: Secondary | ICD-10-CM | POA: Diagnosis not present

## 2017-05-24 NOTE — Patient Instructions (Signed)
MRI brain wwo contrast in May 2019  We will call you with the results  Return to clinic 6 months

## 2017-06-07 ENCOUNTER — Other Ambulatory Visit: Payer: Self-pay | Admitting: Physician Assistant

## 2017-06-07 DIAGNOSIS — A6000 Herpesviral infection of urogenital system, unspecified: Secondary | ICD-10-CM

## 2017-06-07 NOTE — Telephone Encounter (Signed)
Want to refill this? I've never seen pt.

## 2017-06-09 ENCOUNTER — Ambulatory Visit
Admission: RE | Admit: 2017-06-09 | Discharge: 2017-06-09 | Disposition: A | Discharge status: Home / Self Care | Payer: Managed Care, Other (non HMO) | Source: Ambulatory Visit | Attending: Neurology | Admitting: Neurology

## 2017-06-09 DIAGNOSIS — Z9889 Other specified postprocedural states: Principal | ICD-10-CM

## 2017-06-09 DIAGNOSIS — G44219 Episodic tension-type headache, not intractable: Secondary | ICD-10-CM

## 2017-06-09 DIAGNOSIS — Z86018 Personal history of other benign neoplasm: Secondary | ICD-10-CM

## 2017-06-09 MED ORDER — GADOBENATE DIMEGLUMINE 529 MG/ML IV SOLN
15.0000 mL | Freq: Once | INTRAVENOUS | Status: AC | PRN
  Administered 2017-06-09: 15 mL via INTRAVENOUS

## 2017-06-13 ENCOUNTER — Telehealth: Payer: Self-pay | Admitting: *Deleted

## 2017-06-13 NOTE — Telephone Encounter (Signed)
Left message giving patient results.  

## 2017-06-13 NOTE — Telephone Encounter (Signed)
-----   Message from Alda Berthold, DO sent at 06/12/2017  4:26 PM EDT ----- Please inform patient that her MRI brain is stable - you may need to call her, she is not active on mychart.

## 2017-07-21 ENCOUNTER — Other Ambulatory Visit: Payer: Self-pay | Admitting: Physician Assistant

## 2017-07-21 DIAGNOSIS — A6000 Herpesviral infection of urogenital system, unspecified: Secondary | ICD-10-CM

## 2017-07-21 NOTE — Telephone Encounter (Signed)
Valtrex refill Last OV: 10/15/16 Last refill:06/07/17 30 tab/0 refill by Weber [id number]; last CPE done by Sagardia 10/15/16-no mention Valtrex  Pharmacy: [id number] - [place], [place] [phone] (Phone) [phone] (Fax)

## 2017-08-27 ENCOUNTER — Other Ambulatory Visit: Payer: Self-pay | Admitting: Physician Assistant

## 2017-08-27 DIAGNOSIS — A6000 Herpesviral infection of urogenital system, unspecified: Secondary | ICD-10-CM

## 2017-08-28 NOTE — Telephone Encounter (Signed)
Left VM for pt to make appt for refills. Filling for 15 days. valacyclovir refill Last Refill:07/21/17 # 30 no RF Last OV: 07/04/15 PCP: Windell Hummingbird PA Pharmacy:[facility] [address]

## 2017-11-13 ENCOUNTER — Encounter (HOSPITAL_COMMUNITY): Payer: Self-pay | Admitting: Emergency Medicine

## 2017-11-13 ENCOUNTER — Emergency Department (HOSPITAL_COMMUNITY)
Admission: EM | Admit: 2017-11-13 | Discharge: 2017-11-13 | Disposition: A | Discharge status: Home / Self Care | Payer: Managed Care, Other (non HMO) | Attending: Emergency Medicine | Admitting: Emergency Medicine

## 2017-11-13 DIAGNOSIS — Z79899 Other long term (current) drug therapy: Secondary | ICD-10-CM | POA: Diagnosis not present

## 2017-11-13 DIAGNOSIS — K5900 Constipation, unspecified: Secondary | ICD-10-CM | POA: Diagnosis not present

## 2017-11-13 DIAGNOSIS — Z7982 Long term (current) use of aspirin: Secondary | ICD-10-CM | POA: Diagnosis not present

## 2017-11-13 DIAGNOSIS — R1084 Generalized abdominal pain: Secondary | ICD-10-CM | POA: Diagnosis not present

## 2017-11-13 LAB — I-STAT BETA HCG BLOOD, ED (MC, WL, AP ONLY): I-stat hCG, quantitative: <5 m[IU]/mL (ref <5)

## 2017-11-13 LAB — COMPREHENSIVE METABOLIC PANEL
ALBUMIN: 4.2 g/dL (ref 3.5–5.0)
ALT: 15 U/L (ref 0–44)
ANION GAP: 10 (ref 5–15)
AST: 19 U/L (ref 15–41)
Alkaline Phosphatase: 47 U/L (ref 38–126)
BILIRUBIN TOTAL: 0.7 mg/dL (ref 0.3–1.2)
BUN: 8 mg/dL (ref 6–20)
CO2: 26 mmol/L (ref 22–32)
CREATININE: 0.62 mg/dL (ref 0.44–1.00)
Calcium: 9.5 mg/dL (ref 8.9–10.3)
Chloride: 107 mmol/L (ref 98–111)
GFR calc Af Amer: >60 mL/min (ref >60)
GFR calc non Af Amer: >60 mL/min (ref >60)
Glucose, Bld: 76 mg/dL (ref 70–99)
POTASSIUM: 4.4 mmol/L (ref 3.5–5.1)
Sodium: 143 mmol/L (ref 135–145)
Total Protein: 7.4 g/dL (ref 6.5–8.1)

## 2017-11-13 LAB — URINALYSIS, ROUTINE W REFLEX MICROSCOPIC
Bilirubin Urine: NEGATIVE
GLUCOSE, UA: NEGATIVE mg/dL
HGB URINE DIPSTICK: NEGATIVE
Ketones, ur: NEGATIVE mg/dL
LEUKOCYTES UA: NEGATIVE
Nitrite: NEGATIVE
PH: 6 (ref 5.0–8.0)
Protein, ur: NEGATIVE mg/dL
SPECIFIC GRAVITY, URINE: 1.006 (ref 1.005–1.030)

## 2017-11-13 LAB — LIPASE, BLOOD: Lipase: 32 U/L (ref 11–51)

## 2017-11-13 LAB — CBC
HEMATOCRIT: 39 % (ref 36.0–46.0)
Hemoglobin: 13.1 g/dL (ref 12.0–15.0)
MCH: 31.2 pg (ref 26.0–34.0)
MCHC: 33.6 g/dL (ref 30.0–36.0)
MCV: 92.9 fL (ref 78.0–100.0)
Platelets: 239 10*3/uL (ref 150–400)
RBC: 4.2 MIL/uL (ref 3.87–5.11)
RDW: 13.6 % (ref 11.5–15.5)
WBC: 5.2 10*3/uL (ref 4.0–10.5)

## 2017-11-13 MED ORDER — MAGNESIUM CITRATE PO SOLN: 1.0000 | Freq: Once | ORAL | 0 refills | Status: AC

## 2017-11-13 MED ORDER — DOCUSATE SODIUM 100 MG PO CAPS: 100.0000 mg | ORAL_CAPSULE | Freq: Two times a day (BID) | ORAL | 0 refills | Status: DC

## 2017-11-13 MED ORDER — POLYETHYLENE GLYCOL 3350 17 G PO PACK: 17.0000 g | PACK | Freq: Every day | ORAL | 0 refills | Status: DC

## 2017-11-13 NOTE — ED Triage Notes (Signed)
Pt reports that she hasnt had BM in 5 days and been taking OTC stool softeners twice with no relief. For months been having right side pain that radiates down right leg.

## 2017-11-13 NOTE — ED Provider Notes (Signed)
Emergency Department Provider Note   I have reviewed the triage vital signs and the nursing notes.   HISTORY  Chief Complaint Abdominal Pain and Constipation   HPI Tina Henson is a 42 y.o. female without significant past medical history the presents the emergency department today with abdominal pain associate with constipation.  She states that this seems to make the right-sided pain is radiating down her leg worse.  It is almost not there and then when she does have constipation it seems to be bad again.  She had decreased bowel movements for a few days.  And passing gas.  She states she did have a bowel movement this morning but it is less than what she expected.  She feels full in her abdomen.  No fevers, nausea, vomiting, rashes, injuries or surgeries recently. No other associated or modifying symptoms.    Past Medical History:  Diagnosis Date  . Hearing loss   . Heart murmur   . Herpes   . S/P excision of acoustic neuroma 2010  . Sinusitis     Patient Active Problem List   Diagnosis Date Noted  . Genital herpes 07/04/2015  . S/P excision of acoustic neuroma 10/23/2014    Past Surgical History:  Procedure Laterality Date  . BRAIN SURGERY    . UTERINE FIBROID SURGERY      Current Outpatient Rx  . Order #: [id number] Class: Historical Med  . Order #: [id number] Class: Historical Med  . Order #: [id number] Class: Historical Med  . Order #: [id number] Class: Print  . Order #: [id number] Class: Print  . Order #: [id number] Class: Print  . Order #: [id number] Class: Normal  . Order #: [id number] Class: Print  . Order #: [id number] Class: Print  . Order #: [id number] Class: Normal    Allergies Patient has no known allergies.  Family History  Problem Relation Age of Onset  . Cancer Mother   . Diabetes Mother   . Stroke Father   . Diabetes Sister     Social History Social History   Tobacco Use  . Smoking status: Never Smoker  . Smokeless tobacco: Never Used   Substance Use Topics  . Alcohol use: Yes    Alcohol/week: 0.0 standard drinks    Comment: socially - glass of wine every 2-4 weeks  . Drug use: No    Review of Systems  All other systems negative except as documented in the HPI. All pertinent positives and negatives as reviewed in the HPI. ____________________________________________   PHYSICAL EXAM:  VITAL SIGNS: ED Triage Vitals [11/13/17 1336]  Enc Vitals Group     BP 127/85     Pulse Rate 80     Resp 20     Temp 98.6 F (37 C)     Temp Source Oral     SpO2 100 %     Weight 172 lb (78 kg)     Height 5\' 2"  (1.575 m)     Head Circumference      Peak Flow      Pain Score 8     Pain Loc      Pain Edu?      Excl. in [place]?     Constitutional: Alert and oriented. Well appearing and in no acute distress. Eyes: Conjunctivae are normal. PERRL. EOMI. Head: Atraumatic. Nose: No congestion/rhinnorhea. Mouth/Throat: Mucous membranes are moist.  Oropharynx non-erythematous. Neck: No stridor.  No meningeal signs.   Cardiovascular: Normal rate, regular rhythm. Good peripheral circulation. Grossly normal heart sounds.   Respiratory: Normal  respiratory effort.  No retractions. Lungs CTAB. Gastrointestinal: Soft and nontender. No distention.  Musculoskeletal: No lower extremity tenderness nor edema. No gross deformities of extremities. Neurologic:  Normal speech and language. No gross focal neurologic deficits are appreciated.  Skin:  Skin is warm, dry and intact. No rash noted.  ____________________________________________   LABS (all labs ordered are listed, but only abnormal results are displayed)  Labs Reviewed  URINALYSIS, ROUTINE W REFLEX MICROSCOPIC - Abnormal; Notable for the following components:      Result Value   APPearance HAZY (*)    All other components within normal limits  LIPASE, BLOOD  COMPREHENSIVE METABOLIC PANEL  CBC  I-STAT BETA HCG BLOOD, ED ([facility], [facility], [facility] ONLY)    ____________________________________________   INITIAL IMPRESSION / ASSESSMENT AND PLAN / ED COURSE  Work-up overall unremarkable.  Likely dehydration causing symptoms.  No indication for admission or emergent workup for constipation at this time. Stable for dc.      Pertinent labs & imaging results that were available during my care of the patient were reviewed by me and considered in my medical decision making (see chart for details).  ____________________________________________  FINAL CLINICAL IMPRESSION(S) / ED DIAGNOSES  Final diagnoses:  Constipation, unspecified constipation type    MEDICATIONS GIVEN DURING THIS VISIT:  Medications - No data to display   NEW OUTPATIENT MEDICATIONS STARTED DURING THIS VISIT:  Discharge Medication List as of 11/13/2017  6:10 PM    START taking these medications   Details  docusate sodium (COLACE) 100 MG capsule Take 1 capsule (100 mg total) by mouth every 12 (twelve) hours., Starting Mon 11/13/2017, Print    magnesium citrate SOLN Take 296 mLs (1 Bottle total) by mouth once for 1 dose., Starting Mon 11/13/2017, Print    polyethylene glycol (MIRALAX / GLYCOLAX) packet Take 17 g by mouth daily., Starting Mon 11/13/2017, Print        Note:  This note was prepared with assistance of Dragon voice recognition software. Occasional wrong-word or sound-a-like substitutions may have occurred due to the inherent limitations of voice recognition software.   Merrily Pew, MD 11/13/17 2212

## 2017-11-13 NOTE — Discharge Instructions (Addendum)

## 2017-11-15 ENCOUNTER — Ambulatory Visit: Payer: Managed Care, Other (non HMO) | Admitting: Neurology

## 2017-12-01 ENCOUNTER — Ambulatory Visit: Payer: Managed Care, Other (non HMO) | Admitting: Neurology

## 2017-12-01 ENCOUNTER — Encounter: Payer: Self-pay | Admitting: Neurology

## 2017-12-01 VITALS — BP 94/64 | HR 69 | Ht 62.5 in | Wt 168.2 lb

## 2017-12-01 DIAGNOSIS — H905 Unspecified sensorineural hearing loss: Secondary | ICD-10-CM

## 2017-12-01 DIAGNOSIS — G5601 Carpal tunnel syndrome, right upper limb: Secondary | ICD-10-CM

## 2017-12-01 DIAGNOSIS — Z9889 Other specified postprocedural states: Secondary | ICD-10-CM

## 2017-12-01 DIAGNOSIS — Z86018 Personal history of other benign neoplasm: Secondary | ICD-10-CM

## 2017-12-01 DIAGNOSIS — G5621 Lesion of ulnar nerve, right upper limb: Secondary | ICD-10-CM | POA: Diagnosis not present

## 2017-12-01 NOTE — Patient Instructions (Addendum)
Start using a soft elbow pad to minimize flexion of the elbow   Continue to use wrist splint  Referral to audiology  Return to clinic in 6 months

## 2017-12-01 NOTE — Progress Notes (Signed)
Follow-up Visit   Date: 12/01/17    Tina Henson MRN: [id number] DOB: [birth year]   Interim History: Tina Henson is a 42 y.o. 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 The patient was accompanied to the clinic by self.  History of present illness: Starting around the Spring of 2018, she develop tingling over the lips, forehead, and bilateral ear achy pain. Tingling of the face is intermittent, sometime lasting 1-2 days occurring once every 2 weeks. She has not identified any triggers for facial tingling.  She denies any double vision, facial weakness, or difficulty/swallowing talking.  Her each pain is exacerbated by loud noises.  It is better with nasal spray which has helped her pain some.    She was last seen by Dr. Redmond Pulling, neurosurgery at [facility], in 2016 for left acoustic neuroma.  She had resection in 2010 while living in [place] and has residual tumor that is followed by serial imaging.  I do not have her MRI brain from 2016 to review personally, but report shows that her imaging was stable.  She has history of throbbing pain over the forehead from before her surgery which occurs about once per week and responds to OTC such as ibuprofen or aspirin. She continues to be bothered by her right ear pain and left tinnitus and hearing loss and would like to see ENT.  In the past, cochlear implant was recommended, but she did not want to pursue this.  She also complains tingling of the right 2nd and 3rd fingers, which has been constant for 1 year.  Sometimes she wakes up with her hand numbness and has to shake it at night to wake it up.  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    She continues to have right hand paresthesias which as improved when using wrist splint, but admits to only using this for a few  weeks.    UPDATE 12/01/2017:  She is here for follow-up visit.  She would like to have a audiology evaluation for ongoing left hearing problems.  She still has tinging of the right arm especially around the elbow, which is usually worse when she is using her phone or at the computer.  No weakness of the hands.  Headaches are doing well, a few weeks ago, they were more frequent and have improved.    Medications:  Current Outpatient Medications on File Prior to Visit  Medication Sig Dispense Refill  . aspirin 500 MG tablet Take 1,000 mg by mouth every 6 (six) hours as needed for pain.    Marland Kitchen docusate sodium (COLACE) 100 MG capsule Take 1 capsule (100 mg total) by mouth every 12 (twelve) hours. 60 capsule 0  . ibuprofen (ADVIL,MOTRIN) 800 MG tablet Take 1 tablet (800 mg total) by mouth 3 (three) times daily. 21 tablet 0  . polyethylene glycol (MIRALAX / GLYCOLAX) packet Take 17 g by mouth daily. 14 each 0  . valACYclovir (VALTREX) 500 MG tablet TAKE 1 TABLET BY MOUTH DAILY FOR SUPPRESSION & INCREASE TO TWICE DAILY FOR 3 DAYS FOR OUTBREAK (Patient taking differently: Take 500 mg by mouth daily as needed (outbreaks). ) 15 tablet 0  . VITAMIN A PO Take 1 tablet by mouth daily.    . meloxicam (MOBIC) 15 MG tablet Take 1 tablet (15 mg total) by mouth daily. (Patient  not taking: Reported on 11/13/2017) 15 tablet 0  . methocarbamol (ROBAXIN) 500 MG tablet Take 1 tablet (500 mg total) by mouth 2 (two) times daily. (Patient not taking: Reported on 11/13/2017) 20 tablet 0   No current facility-administered medications on file prior to visit.     Allergies: No Known Allergies  Review of Systems:  CONSTITUTIONAL: No fevers, chills, night sweats, or weight loss.  EYES: No visual changes or eye pain ENT: No hearing changes.  No history of nose bleeds.   RESPIRATORY: No cough, wheezing and shortness of breath.   CARDIOVASCULAR: Negative for chest pain, and palpitations.   GI: Negative for abdominal discomfort,  blood in stools or black stools.  No recent change in bowel habits.   GU:  No history of incontinence.   MUSCLOSKELETAL: +history of joint pain or swelling.  No myalgias.   SKIN: Negative for lesions, rash, and itching.   ENDOCRINE: Negative for cold or heat intolerance, polydipsia or goiter.   PSYCH:  No depression or anxiety symptoms.   NEURO: As Above.   Vital Signs:  BP 94/64   Pulse 69   Ht 5' 2.5" (1.588 m)   Wt 168 lb 4 oz (76.3 kg)   SpO2 98%   BMI 30.28 kg/m    General Medical Exam:   General:  Well appearing, comfortable  Eyes/ENT: see cranial nerve examination.   Neck: No masses appreciated.   No carotid bruits. Respiratory:  Clear to auscultation, good air entry bilaterally.   Cardiac:  Regular rate and rhythm, no murmur.   Ext:  No edema  Neurological Exam: MENTAL STATUS including orientation to time, place, person, recent and remote memory, attention span and concentration, language, and fund of knowledge is normal.  Speech is not dysarthric.  CRANIAL NERVES:  Pupils equal round and reactive to light.  Normal conjugate, extra-ocular eye movements in all directions of gaze.  No ptosis. Face is symmetric. Reduced finger rub on the left.  MOTOR:  Motor strength is 5/5 in all extremities.  MSRs:  Reflexes are 2+/4 throughout.  COORDINATION/GAIT:    Gait narrow based and stable.   Data: MRI brain wwo contrast 12/06/2014:  Similar expansion of the left internal auditory canal with mixed signal intensity material which exhibits heterogeneous enhancement. Findings may reflect scar tissue or residual tumor, however there is no appreciable change in appearance compared to the outside MRI dated 08/28/2012.  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 Lacy enhancement within the left internal auditory canal may represent postop enhancement. As there are no prior studies, follow-up MRI suggested to evaluate for interval changes.  Residual or recurrent tumor cannot be excluded based on this single examination.  MRI brain wwo contrast 06/09/2017: 1. History of left IAC resection in 2010. Stable enhancement at the postoperative left internal auditory canal. Wispy pattern and stability favors scarring over residual tumor. Per report, postoperative left IAC enhancement has been noted by MRI since at least 2014. 2. No new finding.  NCS/EMG of the right upper extremity 07/14/2016: 1. Right median neuropathy at or distal to the wrist consistent with a clinical diagnosis of carpal tunnel syndrome; very mild in degree electrically. 2. Right ulnar neuropathy across the elbow, purely demyelinating in type.  Lab Results  Component Value Date   TSH 0.955 10/15/2016   Lab Results  Component Value Date   FOLATE >24.0 07/14/2016   Lab Results  Component Value Date   [id number] 595 07/14/2016  IMPRESSION/PLAN: 1.  History of left acoustic neuroma s/p excision (2010) with residual scarring and hearing loss, stable on MRI brain 05/2017.  She would like to see audiology for options on hearing loss. 2.  Right carpal tunnel syndrome, very mild.  Encouraged compliance with wrist splint 4.  Right ulnar neuropathy, worsening. Start using soft elbow pad.  Return to clinic in 6 months  Thank you for allowing me to participate in patient's care.  If I can answer any additional questions, I would be pleased to do so.    Sincerely,    Eri Platten K. Posey Pronto, DO

## 2018-01-01 ENCOUNTER — Other Ambulatory Visit: Payer: Self-pay | Admitting: Family Medicine

## 2018-01-01 DIAGNOSIS — N644 Mastodynia: Secondary | ICD-10-CM

## 2018-01-05 ENCOUNTER — Ambulatory Visit
Admission: RE | Admit: 2018-01-05 | Discharge: 2018-01-05 | Disposition: A | Discharge status: Home / Self Care | Payer: 59 | Source: Ambulatory Visit | Attending: Family Medicine | Admitting: Family Medicine

## 2018-01-05 ENCOUNTER — Ambulatory Visit
Admission: RE | Admit: 2018-01-05 | Discharge: 2018-01-05 | Disposition: A | Discharge status: Home / Self Care | Payer: Managed Care, Other (non HMO) | Source: Ambulatory Visit | Attending: Family Medicine | Admitting: Family Medicine

## 2018-01-05 DIAGNOSIS — N644 Mastodynia: Secondary | ICD-10-CM

## 2018-05-21 ENCOUNTER — Encounter (HOSPITAL_BASED_OUTPATIENT_CLINIC_OR_DEPARTMENT_OTHER): Payer: Self-pay | Admitting: Emergency Medicine

## 2018-05-21 ENCOUNTER — Telehealth: Payer: Self-pay | Admitting: Neurology

## 2018-05-21 ENCOUNTER — Emergency Department (HOSPITAL_BASED_OUTPATIENT_CLINIC_OR_DEPARTMENT_OTHER)
Admission: EM | Admit: 2018-05-21 | Discharge: 2018-05-21 | Disposition: A | Discharge status: Home / Self Care | Payer: 59 | Attending: Emergency Medicine | Admitting: Emergency Medicine

## 2018-05-21 ENCOUNTER — Emergency Department (HOSPITAL_BASED_OUTPATIENT_CLINIC_OR_DEPARTMENT_OTHER): Payer: 59

## 2018-05-21 ENCOUNTER — Other Ambulatory Visit: Payer: Self-pay

## 2018-05-21 DIAGNOSIS — Z79899 Other long term (current) drug therapy: Secondary | ICD-10-CM | POA: Insufficient documentation

## 2018-05-21 DIAGNOSIS — R42 Dizziness and giddiness: Secondary | ICD-10-CM | POA: Diagnosis not present

## 2018-05-21 LAB — CBG MONITORING, ED: Glucose-Capillary: 111 mg/dL — ABNORMAL HIGH (ref 70–99)

## 2018-05-21 NOTE — Telephone Encounter (Signed)
chelsea will call to set up the appointment.

## 2018-05-21 NOTE — Telephone Encounter (Signed)
Please advise 

## 2018-05-21 NOTE — ED Triage Notes (Signed)
Reports dizziness for 3 days.  Denies nausea.  C/o numbness to bilateral hands, right worse than left with history of carpal tunnel.  Ambulatory to room in NAD.  Alert and oriented x 4.

## 2018-05-21 NOTE — Telephone Encounter (Signed)
Pls offer patient virtual visit for evaluation.

## 2018-05-21 NOTE — Discharge Instructions (Addendum)
CT scan showed no new findings. Follow up with your neurologist, you likely need MRI to further evaluate for acoustic neuroma and also follow up carpal tunnel. Please use wrist splints and motrin for your carpal tunnel pain. Avoid typing etc as well.

## 2018-05-21 NOTE — Telephone Encounter (Signed)
Patient states that she is having Vertigo for the last couple of days and needs to talk to Dr Posey Pronto

## 2018-05-21 NOTE — ED Provider Notes (Signed)
[facility] EMERGENCY DEPARTMENT Provider Note   CSN: [id number] Arrival date & time: 05/21/18  1914    History   Chief Complaint Chief Complaint  Patient presents with  . Dizziness    HPI Tina Henson is a 43 y.o. female.     The history is provided by the patient.  Dizziness  Quality:  Imbalance Severity:  Mild Onset quality:  Gradual Timing:  Intermittent Progression:  Waxing and waning Chronicity:  Recurrent Context: standing up   Relieved by:  Being still Worsened by:  Nothing Associated symptoms: no blood in stool, no chest pain, no diarrhea, no headaches, no nausea, no palpitations, no shortness of breath, no syncope, no tinnitus, no vision changes, no vomiting and no weakness   Risk factors comment:  Hx of acoustic neuroma and carpal tunnel    Past Medical History:  Diagnosis Date  . Hearing loss   . Heart murmur   . Herpes   . S/P excision of acoustic neuroma 2010  . Sinusitis     Patient Active Problem List   Diagnosis Date Noted  . Genital herpes 07/04/2015  . S/P excision of acoustic neuroma 10/23/2014    Past Surgical History:  Procedure Laterality Date  . BRAIN SURGERY    . UTERINE FIBROID SURGERY       OB History   No obstetric history on file.      Home Medications    Prior to Admission medications   Medication Sig Start Date End Date Taking? Authorizing Provider  aspirin 500 MG tablet Take 1,000 mg by mouth every 6 (six) hours as needed for pain.    [provider]  docusate sodium (COLACE) 100 MG capsule Take 1 capsule (100 mg total) by mouth every 12 (twelve) hours. 11/13/17   Mesner, Corene Cornea, MD  ibuprofen (ADVIL,MOTRIN) 800 MG tablet Take 1 tablet (800 mg total) by mouth 3 (three) times daily. 12/07/15   Shary Decamp, PA-C  meloxicam (MOBIC) 15 MG tablet Take 1 tablet (15 mg total) by mouth daily. Patient not taking: Reported on 11/13/2017 11/23/16   Narda Amber K, DO  methocarbamol (ROBAXIN) 500  MG tablet Take 1 tablet (500 mg total) by mouth 2 (two) times daily. Patient not taking: Reported on 11/13/2017 12/07/15   Shary Decamp, PA-C  polyethylene glycol [facility] / Floria Raveling) packet Take 17 g by mouth daily. 11/13/17   Mesner, Corene Cornea, MD  valACYclovir (VALTREX) 500 MG tablet TAKE 1 TABLET BY MOUTH DAILY FOR SUPPRESSION & INCREASE TO TWICE DAILY FOR 3 DAYS FOR OUTBREAK Patient taking differently: Take 500 mg by mouth daily as needed (outbreaks).  08/28/17   Weber, Damaris Hippo, PA-C  VITAMIN A PO Take 1 tablet by mouth daily.    [provider]    Family History Family History  Problem Relation Age of Onset  . Cancer Mother   . Diabetes Mother   . Stroke Father   . Diabetes Sister   . Breast cancer Neg Hx     Social History Social History   Tobacco Use  . Smoking status: Never Smoker  . Smokeless tobacco: Never Used  Substance Use Topics  . Alcohol use: Yes    Alcohol/week: 0.0 standard drinks    Comment: socially - glass of wine every 2-4 weeks  . Drug use: No     Allergies   Patient has no known allergies.   Review of Systems Review of Systems  Constitutional: Negative for chills and fever.  HENT: Negative  for ear pain, sore throat and tinnitus.   Eyes: Negative for pain and visual disturbance.  Respiratory: Negative for cough and shortness of breath.   Cardiovascular: Negative for chest pain, palpitations and syncope.  Gastrointestinal: Negative for abdominal pain, blood in stool, diarrhea, nausea and vomiting.  Genitourinary: Negative for dysuria and hematuria.  Musculoskeletal: Negative for arthralgias, back pain, gait problem, joint swelling, myalgias, neck pain and neck stiffness.  Skin: Negative for color change and rash.  Neurological: Positive for dizziness and numbness. Negative for tremors, seizures, syncope, facial asymmetry, speech difficulty, weakness, light-headedness and headaches.  All other systems reviewed and are negative.    Physical  Exam  ED Triage Vitals [05/21/18 1929]  Enc Vitals Group     BP      Pulse      Resp      Temp      Temp src      SpO2      Weight      Height      Head Circumference      Peak Flow      Pain Score 5     Pain Loc      Pain Edu?      Excl. in [place]?     Physical Exam Vitals signs and nursing note reviewed.  Constitutional:      General: She is not in acute distress.    Appearance: She is well-developed.  HENT:     Head: Normocephalic and atraumatic.     Nose: Nose normal.     Mouth/Throat:     Mouth: Mucous membranes are dry.  Eyes:     Extraocular Movements: Extraocular movements intact.     Conjunctiva/sclera: Conjunctivae normal.     Pupils: Pupils are equal, round, and reactive to light.  Neck:     Musculoskeletal: Normal range of motion and neck supple.  Cardiovascular:     Rate and Rhythm: Normal rate and regular rhythm.     Pulses: Normal pulses.     Heart sounds: Normal heart sounds. No murmur.  Pulmonary:     Effort: Pulmonary effort is normal. No respiratory distress.     Breath sounds: Normal breath sounds.  Abdominal:     Palpations: Abdomen is soft.     Tenderness: There is no abdominal tenderness.  Musculoskeletal: Normal range of motion.        General: No swelling or tenderness.  Skin:    General: Skin is warm and dry.     Capillary Refill: Capillary refill takes less than 2 seconds.  Neurological:     General: No focal deficit present.     Mental Status: She is alert and oriented to person, place, and time.     Cranial Nerves: No cranial nerve deficit.     Sensory: No sensory deficit.     Motor: No weakness.     Gait: Gait normal.     Comments: 5+5 strength throughout, normal sensation, no drift, normal finger-to-nose finger, normal heel-to-shin, normal gait  Psychiatric:        Mood and Affect: Mood normal.      ED Treatments / Results  Labs (all labs ordered are listed, but only abnormal results are displayed) Labs Reviewed  CBG  MONITORING, ED - Abnormal; Notable for the following components:      Result Value   Glucose-Capillary 111 (*)    All other components within normal limits    EKG EKG Interpretation  Date/Time:  Monday  May 21 2018 19:40:51 EDT Ventricular Rate:  79 PR Interval:    QRS Duration: 90 QT Interval:  343 QTC Calculation: 394 R Axis:   -26 Text Interpretation:  Sinus rhythm Borderline left axis deviation Low voltage, precordial leads Confirmed by Lennice Sites [phone]) on 05/21/2018 7:52:35 PM   Radiology Ct Head Wo Contrast  Result Date: 05/21/2018 CLINICAL DATA:  Episodic vertigo EXAM: CT HEAD WITHOUT CONTRAST TECHNIQUE: Contiguous axial images were obtained from the base of the skull through the vertex without intravenous contrast. COMPARISON:  Brain MRI June 09, 2017 FINDINGS: Brain: The ventricles are normal in size and configuration. There is encephalomalacia in the lateral aspect of the superior cerebellum on the left. There is no demonstrable intracranial mass, hemorrhage, extra-axial fluid collection, or midline shift. No evident acute infarct. Vascular: No hyperdense vessel.  No vascular calcification evident. Skull: The patient has had a previous lateral inferior left occipital craniotomy. Bony calvarium otherwise appears intact. Sinuses/Orbits: Visualized paranasal sinuses are clear. Visualized orbits appear symmetric bilaterally. Other: Mastoid air cells are clear. Postoperative change noted in the left internal auditory canal region. 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 No acute infarct. No mass or hemorrhage. Mastoids clear bilaterally. Electronically Signed   By: Lowella Grip III M.D.   On: 05/21/2018 20:24    Procedures Procedures (including critical care time)  Medications Ordered in ED Medications - No data to display   Initial Impression / Assessment and Plan / ED  Course  I have reviewed the triage vital signs and the nursing notes.  Pertinent labs & imaging results that were available during my care of the patient were reviewed by me and considered in my medical decision making (see chart for details).        Tina Henson is a 43 year old female with history of acoustic neuroma status post resection, carpal tunnel syndrome who presents to the ED with intermittent dizziness, carpal tunnel pain.  Patient with normal vitals.  No fever.  Patient with symptoms on and off for the last several days.  Patient follows with neurology for both acoustic neuroma history and carpal tunnel of both hands.  Patient states that she has had intermittent right hand and left hand numbness and tingling.  She had nerve conduction studies done last year and saw a hand surgeon who recommended steroid injections which she refused.  She did not want any surgery as well.  Patient continues to have ongoing pain and numbness and tingling.  She does not wear her splints often as well.  She has normal neurological exam. Normal gait. She states that she has had intermittent dizziness especially when going from a sitting to standing position.  Has a history of the same.  Has not followed up with neurology for her surveillance MRIs.  She overall has normal neurological exam.  No concern for stroke.  Likely patient with chronic issues going on today.  Head CT did not show any acute findings.  Suspect that she needs follow-up with neurology.  Would benefit from outpatient MRI to further evaluate for any possible inner ear issues.  Would likely benefit from referral to physical therapy or a different hand surgeon for carpal tunnel.  Recommend that she wear hand splints.  Recommended rest, Motrin.  Given return precautions and discharged from ED in good condition.  This chart was dictated using voice recognition software.  Despite best efforts to proofread,  errors  can occur which can  change the documentation meaning.   Final Clinical Impressions(s) / ED Diagnoses   Final diagnoses:  Dizziness    ED Discharge Orders    None       Lennice Sites, DO 05/21/18 2028

## 2018-05-24 ENCOUNTER — Telehealth: Payer: Self-pay | Admitting: Neurology

## 2018-05-24 ENCOUNTER — Telehealth: Payer: Self-pay | Admitting: *Deleted

## 2018-05-24 NOTE — Telephone Encounter (Signed)
OK for e-visit.

## 2018-05-24 NOTE — Telephone Encounter (Signed)
Last ED note says she needs follow up and MRI.  Please advise.

## 2018-05-24 NOTE — Telephone Encounter (Signed)
Will you please set up e-visit?

## 2018-05-24 NOTE — Telephone Encounter (Signed)
Patient asked about referral for ENT. She said she hadn't heard anything from them. She was wondering why. Please call her back. Thanks!

## 2018-05-28 NOTE — Telephone Encounter (Signed)
Patient does not need referral to ENT.  We are seeing patient on 06/01/2018 and will discuss then.

## 2018-05-31 ENCOUNTER — Other Ambulatory Visit: Payer: Self-pay | Admitting: *Deleted

## 2018-05-31 DIAGNOSIS — H905 Unspecified sensorineural hearing loss: Secondary | ICD-10-CM

## 2018-05-31 DIAGNOSIS — Z86018 Personal history of other benign neoplasm: Secondary | ICD-10-CM

## 2018-05-31 DIAGNOSIS — Z9889 Other specified postprocedural states: Principal | ICD-10-CM

## 2018-05-31 DIAGNOSIS — H9201 Otalgia, right ear: Secondary | ICD-10-CM

## 2018-05-31 NOTE — Progress Notes (Signed)
   Virtual Visit via Video Note The purpose of this virtual visit is to provide medical care while limiting exposure to the novel coronavirus.    Consent was obtained for video visit:  Yes.   Answered questions that patient had about telehealth interaction:  Yes.   I discussed the limitations, risks, security and privacy concerns of performing an evaluation and management service by telemedicine. I also discussed with the patient that there may be a patient responsible charge related to this service. The patient expressed understanding and agreed to proceed.  Pt location: Home Physician Location: office Name of referring provider:  Leighton Ruff, MD I connected with [place] at patients initiation/request on 06/01/2018 at 11:00 AM EDT by video enabled telemedicine application and verified that I am speaking with the correct person using two identifiers. Pt MRN:  [id number] Pt DOB:  Nov 28, 1975 Video Participants:  Reather Converse Panjih   History of Present Illness: This is a 43 y.o. female returning for new complaints of right ear pain and vertigo.  She began having vertigo one week ago.  Her vertigo is worse with changes in head position and bending.  Around the same time, she had severe achy pain in the right ear.  She was evaluated at Urgent care and found to have ear infection for which she was given one-week course of antibiotics, but she has not noticed any improvement.  She continues to have ear pain and vertigo.    She has known left acoustic neuroma s/p resection in 2010 with left hearing loss.  She is very concerned that she may have a new acoustic neuroma on the right side.  She continues to have right arm numbness/tingling, now sometimes in the left hand.  She has NCS of the right arm which showed mild CTS and cubital tunnel syndrome.     Observations/Objective:   Patient is awake, alert, and appears comfortable.  Oriented x 4.   Extraocular muscles are  intact. Visual quality does not allow me to see nystagmus.  No ptosis.  Face is symmetric.  Speech is not dysarthric.  Antigravity in all extremities.  No pronator drift. Gait appears normal    Assessment and Plan:  1.  Right ear pain and vertigo suggestive of infectious etiology.   Recommend follow-up with PCP for further recommendations.  She can try meclizine 12.mg BID prn for vertigo.  2.  History of left acoustic neuroma s/p excision (2010) with residual scarring and hearing loss, stable on MRI brain 05/2017.  CT head on 4/6 shows prior surgery in left IAC with mild encephalomalacia in the lateral cerebellum. Repeat MRI brain wwo contrast for surveillance.   3.  Very mild right capral tunnel syndrome, continue to use wrist splint  4.  Right ulnar neuropathy, worsening - strategies discussed to minimize nerve injury.  Consider OT going forward    Follow Up Instructions:   I discussed the assessment and treatment plan with the patient. The patient was provided an opportunity to ask questions and all were answered. The patient agreed with the plan and demonstrated an understanding of the instructions.   The patient was advised to call back or seek an in-person evaluation if the symptoms worsen or if the condition fails to improve as anticipated.  Total time spent:  30 minutes     Alda Berthold, DO

## 2018-06-01 ENCOUNTER — Encounter: Payer: Self-pay | Admitting: *Deleted

## 2018-06-01 ENCOUNTER — Other Ambulatory Visit: Payer: Self-pay

## 2018-06-01 ENCOUNTER — Telehealth (INDEPENDENT_AMBULATORY_CARE_PROVIDER_SITE_OTHER): Payer: 59 | Admitting: Neurology

## 2018-06-01 DIAGNOSIS — Z9889 Other specified postprocedural states: Secondary | ICD-10-CM | POA: Diagnosis not present

## 2018-06-01 DIAGNOSIS — Z86018 Personal history of other benign neoplasm: Secondary | ICD-10-CM

## 2018-06-01 DIAGNOSIS — G5601 Carpal tunnel syndrome, right upper limb: Secondary | ICD-10-CM

## 2018-06-01 DIAGNOSIS — H9201 Otalgia, right ear: Secondary | ICD-10-CM

## 2018-06-01 DIAGNOSIS — G5621 Lesion of ulnar nerve, right upper limb: Secondary | ICD-10-CM

## 2018-06-01 MED ORDER — MECLIZINE HCL 12.5 MG PO TABS: 12.5000 mg | ORAL_TABLET | Freq: Two times a day (BID) | ORAL | 0 refills | Status: AC | PRN

## 2018-06-04 ENCOUNTER — Ambulatory Visit: Payer: Managed Care, Other (non HMO) | Admitting: Neurology

## 2018-07-05 ENCOUNTER — Other Ambulatory Visit: Payer: Self-pay | Admitting: *Deleted

## 2018-07-05 DIAGNOSIS — Z9889 Other specified postprocedural states: Secondary | ICD-10-CM

## 2018-07-05 DIAGNOSIS — Z86018 Personal history of other benign neoplasm: Secondary | ICD-10-CM

## 2018-07-12 ENCOUNTER — Telehealth: Payer: Self-pay

## 2018-07-12 NOTE — Telephone Encounter (Signed)
Left message for pt to return call. Has she seen ENT? If not, does she have one? If not, per Dr. Posey Pronto refer to Dr. Minna Merritts [phone] See last OV note for diagnosis.

## 2018-07-13 ENCOUNTER — Other Ambulatory Visit: Payer: Self-pay

## 2018-07-13 DIAGNOSIS — R42 Dizziness and giddiness: Secondary | ICD-10-CM

## 2018-07-13 DIAGNOSIS — D333 Benign neoplasm of cranial nerves: Secondary | ICD-10-CM

## 2018-07-13 DIAGNOSIS — H9201 Otalgia, right ear: Secondary | ICD-10-CM

## 2018-07-13 NOTE — Telephone Encounter (Signed)
You can try sending referral to [facility], if they are accepting new patients.   Thanks for ordering imaging.

## 2018-07-13 NOTE — Telephone Encounter (Signed)
I spoke with pt. She has been made aware that we are working on the referral to ENT and she stated that she was also supposed to have an MRI as well.  Per last OV note you did want her to have a MRI of brain to follow an acoustic neuroma that had previously been excised.  I have put in an order for the MRI and pt was made aware that she should hear from the imaging dept for an appt.  In the meantime, I have called Dr. Jeneen Rinks [place]'s office about the ENT referral and their office has been closed since 06/12/18 and will not reopen until June 19th. I could not make an appt at this time.  Do you want pt to continue to wait for ENT or go somewhere else? She may not be able to be seen at another office due to Covid restrictions, but if you have other physicians in mind we can try.

## 2018-07-13 NOTE — Telephone Encounter (Signed)
Patient returned your call.  Thanks!

## 2018-07-16 ENCOUNTER — Other Ambulatory Visit: Payer: Self-pay

## 2018-07-16 DIAGNOSIS — D333 Benign neoplasm of cranial nerves: Secondary | ICD-10-CM

## 2018-07-16 DIAGNOSIS — R42 Dizziness and giddiness: Secondary | ICD-10-CM

## 2018-07-16 DIAGNOSIS — H905 Unspecified sensorineural hearing loss: Secondary | ICD-10-CM

## 2018-07-16 DIAGNOSIS — H9201 Otalgia, right ear: Secondary | ICD-10-CM

## 2018-07-16 NOTE — Progress Notes (Signed)
[facility] was not a preferred provider with her insurance.

## 2018-07-16 NOTE — Telephone Encounter (Signed)
Referred pt to Dr Pollie Friar office. He is in network with pts ins. [facility] was not.   OV notes, demo, CT scan, ins info all faxed to  Dr Lucia Gaskins [phone], fax [phone]

## 2018-07-20 ENCOUNTER — Telehealth: Payer: Self-pay

## 2018-07-20 NOTE — Telephone Encounter (Signed)
Pt has an appt with Dr. Lucia Gaskins (ENT) 09/06/18 at 1:00

## 2018-08-21 ENCOUNTER — Other Ambulatory Visit: Payer: 59

## 2018-08-23 ENCOUNTER — Other Ambulatory Visit: Payer: Self-pay | Admitting: Family Medicine

## 2018-08-23 DIAGNOSIS — Z1231 Encounter for screening mammogram for malignant neoplasm of breast: Secondary | ICD-10-CM

## 2018-09-03 ENCOUNTER — Ambulatory Visit: Payer: 59 | Admitting: Neurology

## 2018-09-13 ENCOUNTER — Other Ambulatory Visit: Payer: 59

## 2018-09-13 ENCOUNTER — Other Ambulatory Visit: Payer: Self-pay

## 2018-09-13 ENCOUNTER — Ambulatory Visit
Admission: RE | Admit: 2018-09-13 | Discharge: 2018-09-13 | Disposition: A | Discharge status: Home / Self Care | Payer: 59 | Source: Ambulatory Visit | Attending: Neurology | Admitting: Neurology

## 2018-09-13 DIAGNOSIS — D333 Benign neoplasm of cranial nerves: Secondary | ICD-10-CM

## 2018-09-13 MED ORDER — GADOBENATE DIMEGLUMINE 529 MG/ML IV SOLN
15.0000 mL | Freq: Once | INTRAVENOUS | Status: AC | PRN
  Administered 2018-09-13: 15 mL via INTRAVENOUS

## 2018-09-14 ENCOUNTER — Other Ambulatory Visit: Payer: 59

## 2018-10-04 ENCOUNTER — Ambulatory Visit
Admission: RE | Admit: 2018-10-04 | Discharge: 2018-10-04 | Disposition: A | Discharge status: Home / Self Care | Payer: 59 | Source: Ambulatory Visit | Attending: Family Medicine | Admitting: Family Medicine

## 2018-10-04 ENCOUNTER — Other Ambulatory Visit: Payer: Self-pay

## 2018-10-04 DIAGNOSIS — Z1231 Encounter for screening mammogram for malignant neoplasm of breast: Secondary | ICD-10-CM

## 2018-11-19 ENCOUNTER — Encounter: Payer: Self-pay | Admitting: Neurology

## 2018-11-19 ENCOUNTER — Ambulatory Visit: Payer: Managed Care, Other (non HMO) | Admitting: Neurology

## 2018-11-19 ENCOUNTER — Other Ambulatory Visit: Payer: Self-pay

## 2018-11-19 VITALS — BP 118/70 | HR 78 | Ht 62.0 in | Wt 156.0 lb

## 2018-11-19 DIAGNOSIS — G5621 Lesion of ulnar nerve, right upper limb: Secondary | ICD-10-CM | POA: Diagnosis not present

## 2018-11-19 DIAGNOSIS — M2550 Pain in unspecified joint: Secondary | ICD-10-CM | POA: Diagnosis not present

## 2018-11-19 DIAGNOSIS — G5601 Carpal tunnel syndrome, right upper limb: Secondary | ICD-10-CM | POA: Diagnosis not present

## 2018-11-19 DIAGNOSIS — Z86018 Personal history of other benign neoplasm: Secondary | ICD-10-CM

## 2018-11-19 DIAGNOSIS — Z9889 Other specified postprocedural states: Secondary | ICD-10-CM

## 2018-11-19 NOTE — Progress Notes (Signed)
Follow-up Visit   Date: 11/19/18    Tina Henson MRN: [id number] DOB: 03-06-1975   Interim History: Tina Henson is a 43 y.o. right-handed African female with left acoustic neuroma resection (2010 in [place]) with residual hearing loss and chronic tinnitus returning to the clinic with new complaints of hand pain. The patient was accompanied to the clinic by self.  History of present illness: Starting around the Spring of 2018, she develop tingling over the lips, forehead, and bilateral ear achy pain. Tingling of the face is intermittent, sometime lasting 1-2 days occurring once every 2 weeks. She has not identified any triggers for facial tingling.  She denies any double vision, facial weakness, or difficulty/swallowing talking.  Her each pain is exacerbated by loud noises.  It is better with nasal spray which has helped her pain some.    She was last seen by Dr. Redmond Pulling, neurosurgery at [facility], in 2016 for left acoustic neuroma.  She had resection in 2010 while living in [place] and has residual tumor that is followed by serial imaging.  I do not have her MRI brain from 2016 to review personally, but report shows that her imaging was stable.  She has history of throbbing pain over the forehead from before her surgery which occurs about once per week and responds to OTC such as ibuprofen or aspirin. She continues to be bothered by her right ear pain and left tinnitus and hearing loss and would like to see ENT.  In the past, cochlear implant was recommended, but she did not want to pursue this.  She continues to have right arm numbness/tingling, now sometimes in the left hand.  She has NCS of the right arm which showed mild CTS and cubital tunnel syndrome.   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      UPDATE 11/19/2018:  She is here for follow-up visit.  She had MRI brain which was stable and saw Dr. Ernestina Patches for right ear pain auditory complaints.   Auditory exam was normal in the right ear and her ear pain has resolved, but she continues to have fullness.  Today, she is complaining of achy pain in the hands which she tries to make a fist.  There is no numbness/tingling or weakness of the hands.  Pain is only present when she is trying to flex her fingers.  Medications:  Current Outpatient Medications on File Prior to Visit  Medication Sig Dispense Refill  . acetaminophen (TYLENOL) 325 MG tablet Take 650 mg by mouth every 6 (six) hours as needed.    . meclizine (ANTIVERT) 12.5 MG tablet Take 1 tablet (12.5 mg total) by mouth 2 (two) times daily as needed for dizziness. 30 tablet 0  . omeprazole (PRILOSEC) 20 MG capsule     . valACYclovir (VALTREX) 500 MG tablet TAKE 1 TABLET BY MOUTH DAILY FOR SUPPRESSION & INCREASE TO TWICE DAILY FOR 3 DAYS FOR OUTBREAK (Patient taking differently: Take 500 mg by mouth daily as needed (outbreaks). ) 15 tablet 0   No current facility-administered medications on file prior to visit.     Allergies: No Known Allergies  Review of Systems:  CONSTITUTIONAL: No fevers, chills, night sweats, or weight loss.  EYES: No visual changes or eye pain ENT: No hearing changes.  No history of nose bleeds.   RESPIRATORY: No cough, wheezing and shortness of breath.   CARDIOVASCULAR: Negative for chest pain, and palpitations.   GI: Negative for  abdominal discomfort, blood in stools or black stools.  No recent change in bowel habits.   GU:  No history of incontinence.   MUSCLOSKELETAL: +history of joint pain or swelling.  No myalgias.   SKIN: Negative for lesions, rash, and itching.   ENDOCRINE: Negative for cold or heat intolerance, polydipsia or goiter.   PSYCH:  No depression +anxiety symptoms.   NEURO: As Above.   Vital Signs:  BP 118/70   Pulse 78   Ht 5\' 2"  (1.575 m)   Wt 156 lb (70.8 kg)   SpO2 98%   BMI 28.53 kg/m   Neurological Exam: MENTAL STATUS including orientation to time, place, person, recent  and remote memory, attention span and concentration, language, and fund of knowledge is normal.  Speech is not dysarthric.  CRANIAL NERVES:  Pupils equal round and reactive to light.  Normal conjugate, extra-ocular eye movements in all directions of gaze.  No ptosis. Reduced hearing on the left.  MOTOR:  Motor strength is 5/5 in all extremities, including intrinsic hand muscles.  No grip myotonia.  She had tenderness over the right 3rd DIP to palpation.  MSRs:  Reflexes are 2+/4 throughout.  COORDINATION/GAIT:    Gait narrow based and stable.   Data: MRI brain wwo contrast 12/06/2014:  Similar expansion of the left internal auditory canal with mixed signal intensity material which exhibits heterogeneous enhancement. Findings may reflect scar tissue or residual tumor, however there is no appreciable change in appearance compared to the outside MRI dated 08/28/2012.  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 Lacy enhancement within the left internal auditory canal may represent postop enhancement. As there are no prior studies, follow-up MRI suggested to evaluate for interval changes. Residual or recurrent tumor cannot be excluded based on this single examination.  MRI brain wwo contrast 06/09/2017: 1. History of left IAC resection in 2010. Stable enhancement at the postoperative left internal auditory canal. Wispy pattern and stability favors scarring over residual tumor. Per report, postoperative left IAC enhancement has been noted by MRI since at least 2014. 2. No new finding.  MRI brain wwo contrast 09/13/2018: No change since 2019. No abnormality seen on the right to explain vertigo and hearing loss.  Previous left occipital craniotomy for acoustic neuroma resection.  Postoperative encephalomalacia of the lateral cerebellum on the left. Postoperative enhancement in the left internal auditory canal,  stable over time and more consistent with scarring  than residual tumor.  NCS/EMG of the right upper extremity 07/14/2016: 1. Right median neuropathy at or distal to the wrist consistent with a clinical diagnosis of carpal tunnel syndrome; very mild in degree electrically. 2. Right ulnar neuropathy across the elbow, purely demyelinating in type.   IMPRESSION/PLAN: 1. Polyarthralgia of the hands  - Reassure patient that her joint pain is not associated with neuropathy  - Follow-up with PCP, if symptoms persist  2.  History of left acoustic neuroma s/p excision (2010) with residual scarring on imaging and left hearing loss.  MRI brain from July 2020 is stable.  3.  Right carpal tunnel syndrome, very mild, continue to use wrist splint at nighttime  4.  Right ulnar neuropathy at the elbow, continue to use soft elbow pad to minimize hyperflexion of the elbow  Return to clinic in 9 months  Greater than 50% of this 15 minute visit was spent in counseling, explanation of diagnosis, planning of further management, and coordination of care.   Thank you for allowing me  to participate in patient's care.  If I can answer any additional questions, I would be pleased to do so.    Sincerely,    Daren Yeagle K. Posey Pronto, DO

## 2018-11-19 NOTE — Patient Instructions (Signed)
Return to clinic in 9 months 

## 2019-06-26 ENCOUNTER — Other Ambulatory Visit (HOSPITAL_COMMUNITY)
Admission: RE | Admit: 2019-06-26 | Discharge: 2019-06-26 | Disposition: A | Discharge status: Home / Self Care | Payer: 59 | Source: Ambulatory Visit | Attending: Family Medicine | Admitting: Family Medicine

## 2019-06-26 ENCOUNTER — Other Ambulatory Visit: Payer: Self-pay | Admitting: Family Medicine

## 2019-06-26 DIAGNOSIS — Z124 Encounter for screening for malignant neoplasm of cervix: Secondary | ICD-10-CM | POA: Insufficient documentation

## 2019-07-01 LAB — CYTOLOGY - PAP
Chlamydia: NEGATIVE
Comment: NEGATIVE
Comment: NEGATIVE
Comment: NORMAL
Diagnosis: NEGATIVE
High risk HPV: NEGATIVE
Neisseria Gonorrhea: NEGATIVE

## 2019-08-23 ENCOUNTER — Ambulatory Visit: Payer: Managed Care, Other (non HMO) | Admitting: Neurology

## 2019-09-16 ENCOUNTER — Ambulatory Visit: Payer: Managed Care, Other (non HMO) | Admitting: Neurology

## 2019-09-30 ENCOUNTER — Other Ambulatory Visit: Payer: Self-pay | Admitting: Family Medicine

## 2019-09-30 DIAGNOSIS — Z1231 Encounter for screening mammogram for malignant neoplasm of breast: Secondary | ICD-10-CM

## 2019-10-14 ENCOUNTER — Ambulatory Visit: Payer: 59

## 2019-10-25 ENCOUNTER — Ambulatory Visit
Admission: RE | Admit: 2019-10-25 | Discharge: 2019-10-25 | Disposition: A | Discharge status: Home / Self Care | Payer: 59 | Source: Ambulatory Visit | Attending: Family Medicine | Admitting: Family Medicine

## 2019-10-25 ENCOUNTER — Other Ambulatory Visit: Payer: Self-pay

## 2019-10-25 DIAGNOSIS — Z1231 Encounter for screening mammogram for malignant neoplasm of breast: Secondary | ICD-10-CM

## 2019-11-18 IMAGING — MR MR HEAD WO/W CM
11 of 12 series · 40 of 48 positions shown · IV contrast (multihance)
Comparison: 08/09/2016. MRI report from [facility]
12/06/2014

CLINICAL DATA: Status post excision of acoustic neuroma. Episodic
tension type headache.

EXAM:
MRI HEAD WITHOUT AND WITH CONTRAST
TECHNIQUE: Multiplanar, multiecho pulse sequences of the brain and surrounding
structures were obtained without and with intravenous contrast.
CONTRAST:  15mL MULTIHANCE GADOBENATE DIMEGLUMINE 529 MG/ML IV SOLN

[Series 2: T1 · sagittal · 5.0mm · 0.45mm/px · 3 of 24 slices shown (1 of 3)]
[im 1/24]
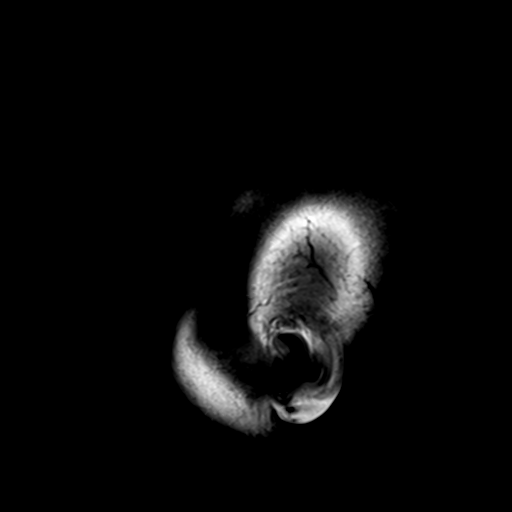
[im 12/24]
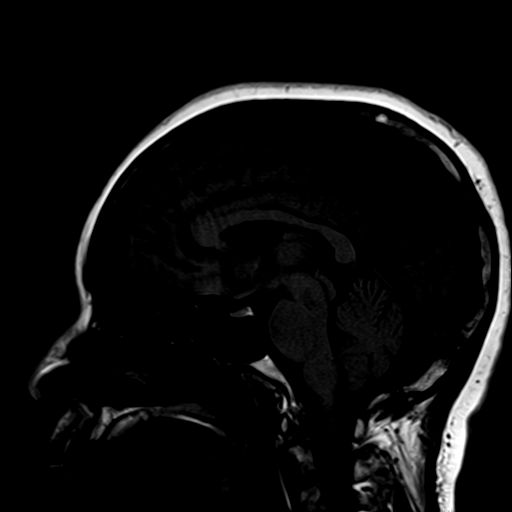
[im 24/24]
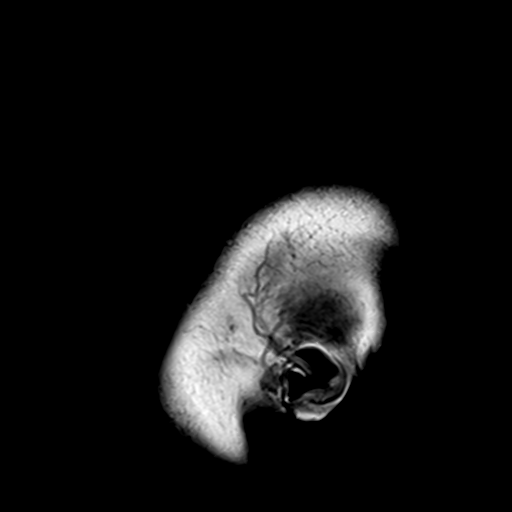

[Series 3: DWI · axial · 3.0mm · 1.80mm/px · z∈[-52,+105]mm · 11 of 108 slices shown (1 of 2)]
[im 1/108]
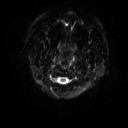
[im 11/108]
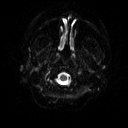
[im 22/108]
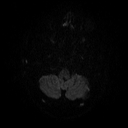
[im 33/108]
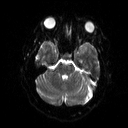
[im 43/108]
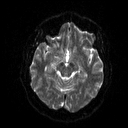
[im 54/108]
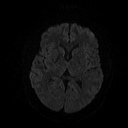
[im 65/108]
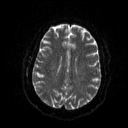
[im 75/108]
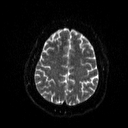
[im 86/108]
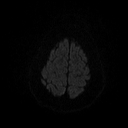
[im 97/108]
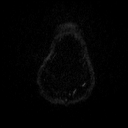
[im 108/108]
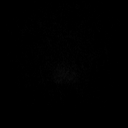

[Series 4: DWI · axial · 3.0mm · 1.80mm/px · z∈[-52,+105]mm · 5 of 54 slices shown (2 of 2)]
[im 1/54]
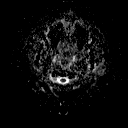
[im 14/54]
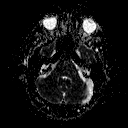
[im 27/54]
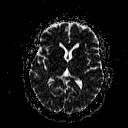
[im 40/54]
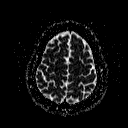
[im 54/54]
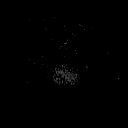

[Series 5: T2 · axial · 5.0mm · 0.45mm/px · z∈[-54,+107]mm · 3 of 26 slices shown]
[im 1/26]
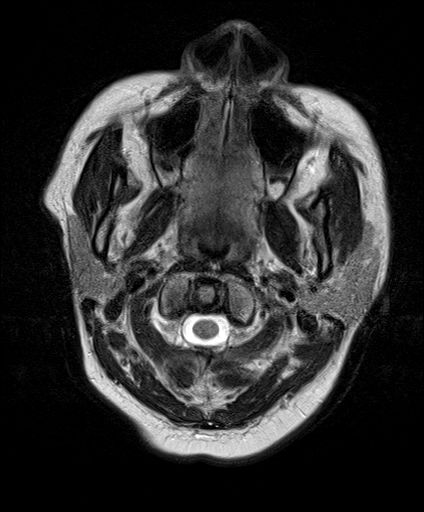
[im 13/26]
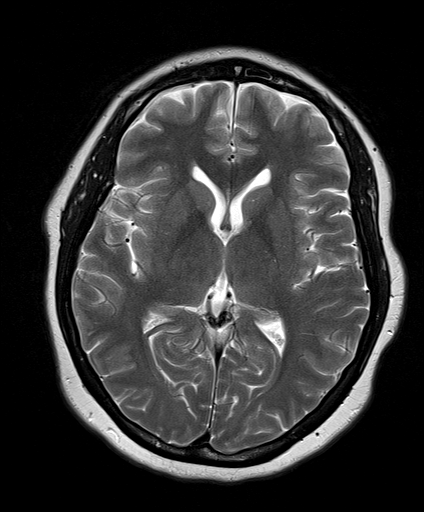
[im 26/26]
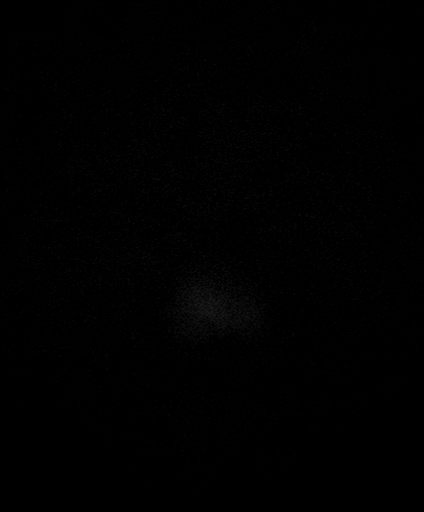

[Series 6: FLAIR · axial · 3.0mm · 0.45mm/px · z∈[-49,+102]mm · 3 of 34 slices shown]
[im 1/34]
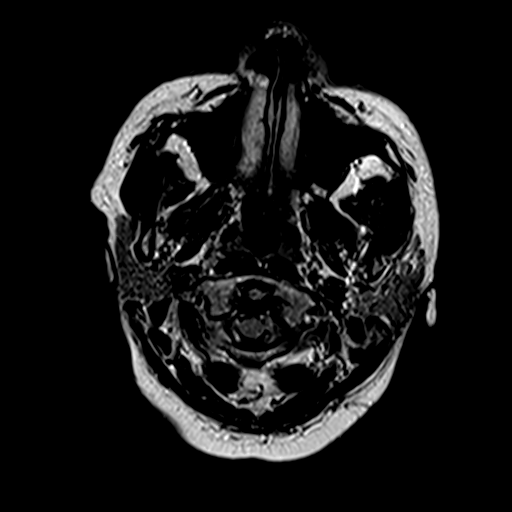
[im 17/34]
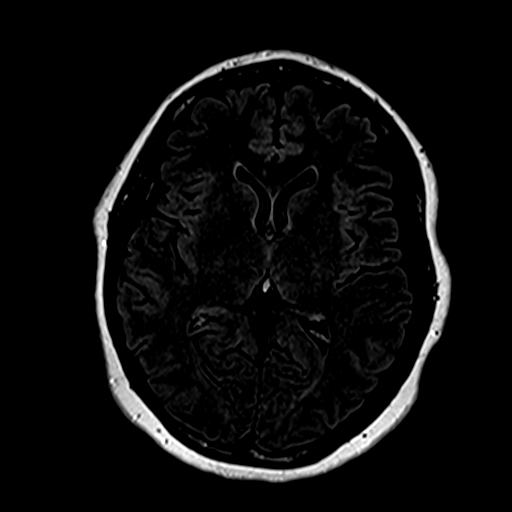
[im 34/34]
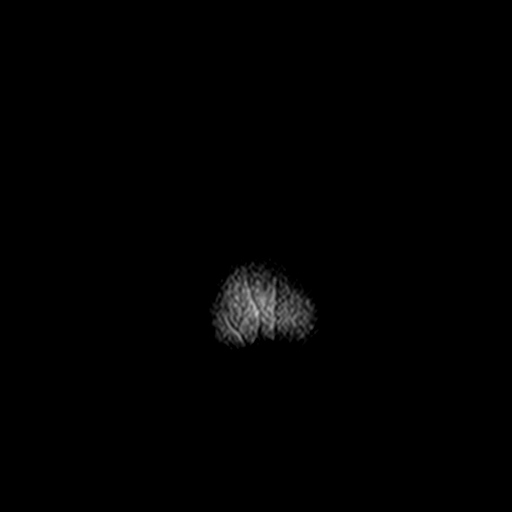

[Series 7: T1 · coronal · 3.0mm · 0.35mm/px · 1 of 14 slices shown (2 of 3)]
[im 1/14]
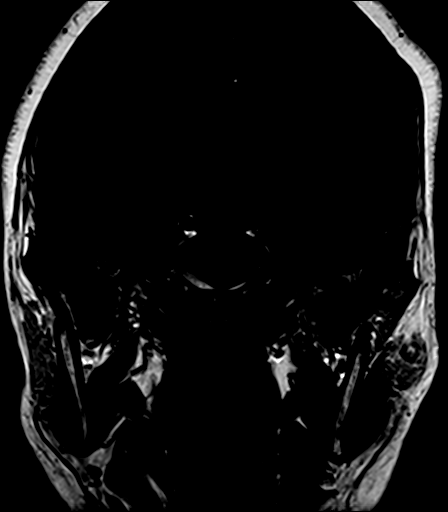

[Series 9: swi_images · axial · 2.0mm · 0.90mm/px · z∈[-52,+105]mm · 8 of 80 slices shown]
[im 1/80]
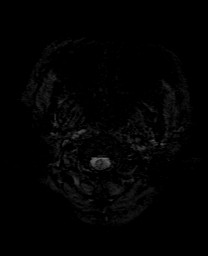
[im 12/80]
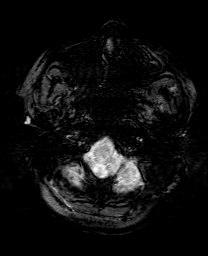
[im 23/80]
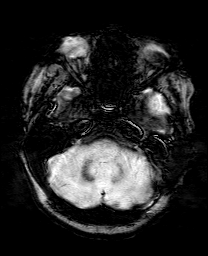
[im 34/80]
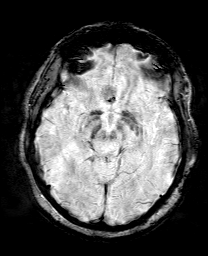
[im 46/80]
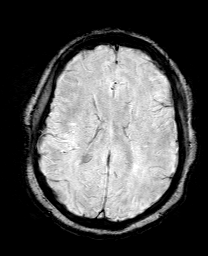
[im 57/80]
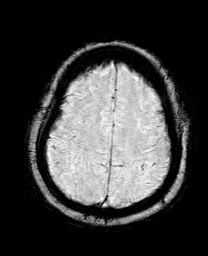
[im 68/80]
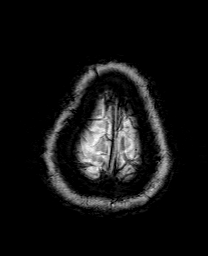
[im 80/80]
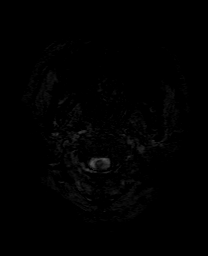

[Series 10: T1 · axial · 3.0mm · 0.35mm/px · 1 of 14 slices shown (3 of 3)]
[im 1/14]
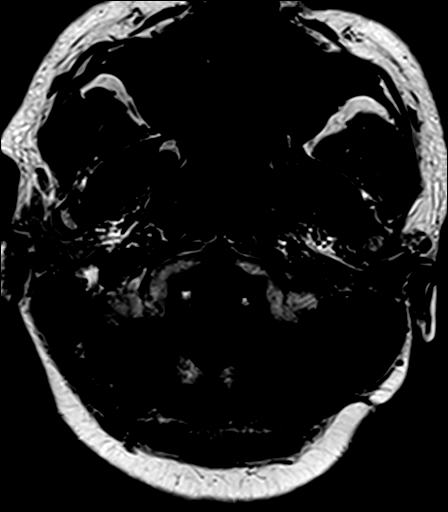

[Series 11: bSSFP · axial · 1.0mm · 0.31mm/px · z∈[-48,-25]mm · 3 of 36 slices shown]
[im 1/36]
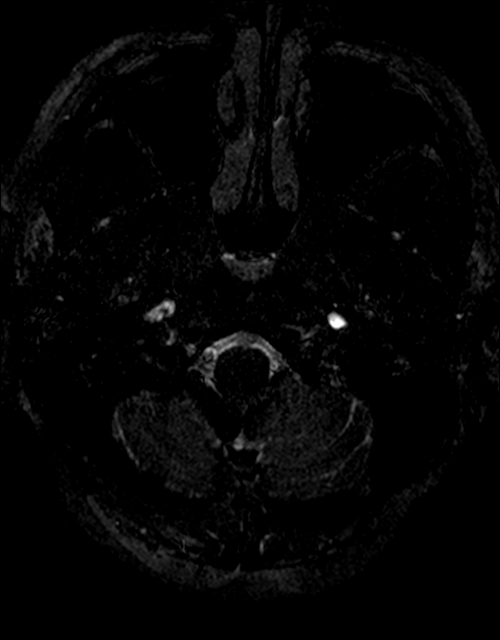
[im 12/36]
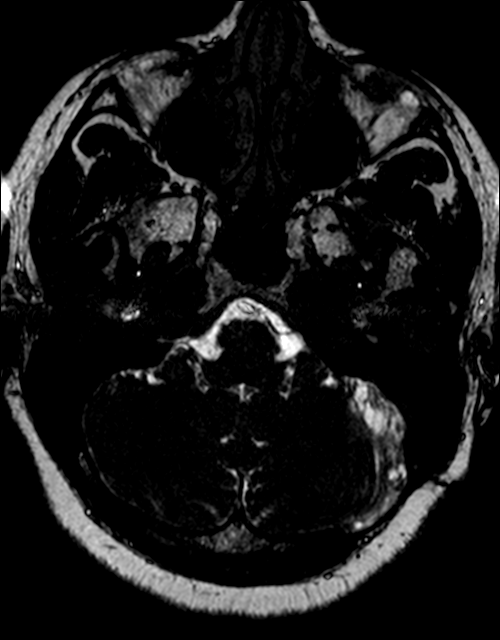
[im 24/36]
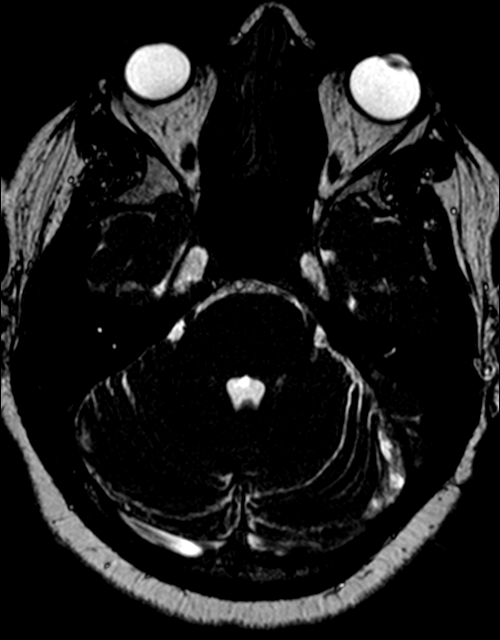

[Series 12: T1 post-contrast · coronal · 3.0mm · 0.35mm/px · 1 of 14 slices shown (1 of 2)]
[im 1/14]
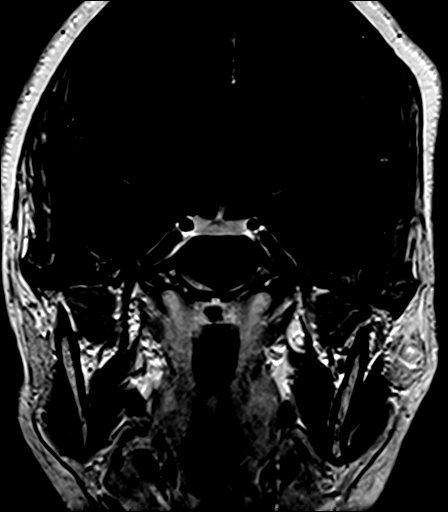

[Series 13: T1 post-contrast · axial · 3.0mm · 0.35mm/px · 1 of 14 slices shown (2 of 2)]
[im 1/14]
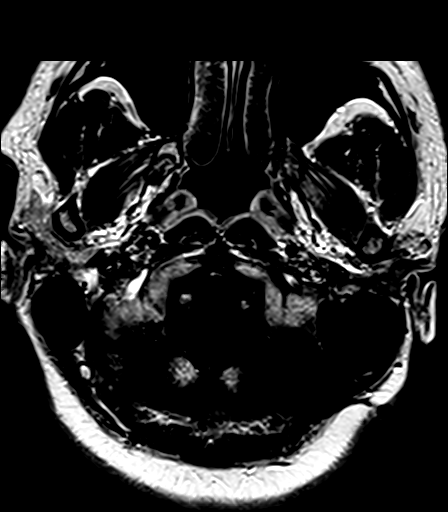

[40 of 48 positions shown; findings below may reference images not displayed]

FINDINGS: Brain: Wispy enhancement within the left internal auditory canal is
unchanged in extent and morphology as permitted by differences in
slice selection. The area of enhancement measures up to 10 x 6 mm.
On thin-section imaging there is a stable heterogeneous signal which
is similar to scarring without enhancement lateral to the left
cerebellum, which is itself mildly gliotic. Stable labyrinthine
signal with incomplete visualization of the superior semi circular
canal, likely chronic postoperative fibrosis.

The supratentorial brain has a normal appearance. No infarct,
hemorrhage, hydrocephalus, or masslike finding.

Vascular: Major flow voids and vascular enhancements are stable.

Skull and upper cervical spine: Unremarkable retromastoid craniotomy
on the left.

Sinuses/Orbits: Negative
IMPRESSION: 1. History of left IAC resection in 8616. Stable enhancement at the
postoperative left internal auditory canal. Wispy pattern and
stability favors scarring over residual tumor. Per report,
postoperative left IAC enhancement has been noted by MRI since at
least 9917.
2. No new finding.

## 2020-03-10 ENCOUNTER — Telehealth: Payer: Self-pay | Admitting: Neurology

## 2020-03-10 DIAGNOSIS — D333 Benign neoplasm of cranial nerves: Secondary | ICD-10-CM

## 2020-03-10 DIAGNOSIS — R202 Paresthesia of skin: Secondary | ICD-10-CM

## 2020-03-10 NOTE — Telephone Encounter (Signed)
Patient wants to speak with a nurse she is having numbness in hands and legs. She has not fell but she is concern   Please call

## 2020-03-10 NOTE — Telephone Encounter (Signed)
No answer at 248

## 2020-03-11 NOTE — Telephone Encounter (Signed)
Called patient and informed her of Dr. Serita Grit recommendations. Patient verbalized understanding and is ok with moving forward with the MRI. Patient is aware that order has been sent to [facility] and was given number incase she needed it. Patient will keep f/u appt and remain on the wait list for her f/u with Dr. Posey Pronto. Patient had no further questions or concerns,

## 2020-03-11 NOTE — Telephone Encounter (Signed)
I'll need to assess her in the office, but in the meantime, she is due to MRI brain to evaluate her prior surgery.  Please order MRI brain wwo contrast.  Thanks.

## 2020-03-11 NOTE — Telephone Encounter (Signed)
Called patient and she wanted to let Dr. Posey Pronto know that she has been having a lot of numbness in her hands and legs that has been getting worse. Patient also stated that on the side of her head where she had surgery her head has been hurting and feeling funny.  Patient states these symptoms have been going on for 2 months. Patient has an appt on 05/01/20 and has been placed on the waiting list.   Informed patient that I would send Dr. Posey Pronto a message and get back to her once I hear back.

## 2020-03-20 ENCOUNTER — Telehealth: Payer: Self-pay | Admitting: Neurology

## 2020-03-20 NOTE — Telephone Encounter (Signed)
Called and spoke to patient and informed her that I looked over her chart and cannot find where we have called her. Informed patient that it was not me and that Dr. Posey Pronto was also out of office. Informed patient that if I hear anything about someone trying to reach out to her I would give her a call and let her know.   Before hanging up patient stated that she has a MRI coming up and maybe it could be them? Or maybe it could be about her appt in March? Informed patient that if I hear anything I will let her know. Patient expressed understanding.

## 2020-03-20 NOTE — Telephone Encounter (Signed)
Patient was returning a call to the office but was not sure who called, she states that they called twice. I checked with the nurse for Dr Posey Pronto and she states that she did not call

## 2020-03-20 NOTE — Telephone Encounter (Signed)
I do not see where any new calls were placed to patient.

## 2020-03-28 ENCOUNTER — Other Ambulatory Visit: Payer: 59

## 2020-04-18 ENCOUNTER — Ambulatory Visit
Admission: RE | Admit: 2020-04-18 | Discharge: 2020-04-18 | Disposition: A | Discharge status: Home / Self Care | Payer: 59 | Source: Ambulatory Visit | Attending: Neurology | Admitting: Neurology

## 2020-04-18 ENCOUNTER — Other Ambulatory Visit: Payer: Self-pay

## 2020-04-18 DIAGNOSIS — R202 Paresthesia of skin: Secondary | ICD-10-CM

## 2020-04-18 DIAGNOSIS — D333 Benign neoplasm of cranial nerves: Secondary | ICD-10-CM

## 2020-04-18 MED ORDER — GADOBENATE DIMEGLUMINE 529 MG/ML IV SOLN
15.0000 mL | Freq: Once | INTRAVENOUS | Status: AC | PRN
  Administered 2020-04-18: 15 mL via INTRAVENOUS

## 2020-05-01 ENCOUNTER — Ambulatory Visit: Payer: 59 | Admitting: Neurology

## 2020-05-01 ENCOUNTER — Encounter: Payer: Self-pay | Admitting: Neurology

## 2020-05-01 ENCOUNTER — Other Ambulatory Visit: Payer: Self-pay

## 2020-05-01 VITALS — BP 111/82 | HR 89 | Ht 62.0 in | Wt 164.2 lb

## 2020-05-01 DIAGNOSIS — G5623 Lesion of ulnar nerve, bilateral upper limbs: Secondary | ICD-10-CM | POA: Diagnosis not present

## 2020-05-01 DIAGNOSIS — G5601 Carpal tunnel syndrome, right upper limb: Secondary | ICD-10-CM

## 2020-05-01 DIAGNOSIS — D333 Benign neoplasm of cranial nerves: Secondary | ICD-10-CM

## 2020-05-01 NOTE — Patient Instructions (Addendum)
Nerve testing of the arms.  Please do not apply oil or moisturizer to your hands/arms on the day of testing.  Start using a soft elbow pad to protect the nerve at the elbow.  Avoid resting the elbows on hard surfaces and over bending the elbow.  Return to clinic in 6 months

## 2020-05-01 NOTE — Progress Notes (Signed)
Follow-up Visit   Date: 05/01/20    Tina Henson MRN: [id number] DOB: Mar 03, 1975   Interim History: Tina Henson is a 45 y.o. right-handed African female with left acoustic neuroma resection (2010 in [place]) with residual hearing loss and chronic tinnitus returning to the clinic with new complaints of hand pain. The patient was accompanied to the clinic by self.  History of present illness: Starting around the Spring of 2018, she develop tingling over the lips, forehead, and bilateral ear achy pain. Tingling of the face is intermittent, sometime lasting 1-2 days occurring once every 2 weeks. She has not identified any triggers for facial tingling.  She denies any double vision, facial weakness, or difficulty/swallowing talking.  Her each pain is exacerbated by loud noises.  It is better with nasal spray which has helped her pain some.    She was last seen by Dr. Redmond Pulling, neurosurgery at [facility], in 2016 for left acoustic neuroma.  She had resection in 2010 while living in [place] and has residual tumor that is followed by serial imaging.  I do not have her MRI brain from 2016 to review personally, but report shows that her imaging was stable.  She has history of throbbing pain over the forehead from before her surgery which occurs about once per week and responds to OTC such as ibuprofen or aspirin. She continues to be bothered by her right ear pain and left tinnitus and hearing loss and would like to see ENT.  In the past, cochlear implant was recommended, but she did not want to pursue this.  She continues to have right arm numbness/tingling, now sometimes in the left hand.  She has NCS of the right arm which showed mild CTS and cubital tunnel syndrome.   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      UPDATE 05/01/2020:  She is here for follow-up and reports worsening bilateral hand paresthesias, worse on the left 4th and 5th fingers.  She also has some  weakness with opening jars/bottles with the left hand.  NCS/EMG of the right arm in 2018 showed very mild right CTS and mild right ulnar neuropathy.  She has been much more careful about hand positioning on the right, which has reduced the intensity and frequency of paresthesias on the right.  Recent MRI brain shows stable changes.   Medications:  Current Outpatient Medications on File Prior to Visit  Medication Sig Dispense Refill  . acetaminophen (TYLENOL) 325 MG tablet Take 650 mg by mouth every 6 (six) hours as needed.    . bisacodyl (DULCOLAX) 5 MG EC tablet Take 5 mg by mouth as needed.    . valACYclovir (VALTREX) 500 MG tablet TAKE 1 TABLET BY MOUTH DAILY FOR SUPPRESSION & INCREASE TO TWICE DAILY FOR 3 DAYS FOR OUTBREAK 15 tablet 0  . Acetaminophen-Caffeine (EXCEDRIN TENSION HEADACHE) 500-65 MG TABS 2 tablets as needed    . meclizine (ANTIVERT) 12.5 MG tablet Take 1 tablet (12.5 mg total) by mouth 2 (two) times daily as needed for dizziness. 30 tablet 0  . omeprazole (PRILOSEC) 20 MG capsule  (Patient not taking: Reported on 05/01/2020)     No current facility-administered medications on file prior to visit.    Allergies: No Known Allergies  Vital Signs:  BP 111/82 (BP Location: Left Arm, Patient Position: Sitting, Cuff Size: Small)   Pulse 89   Ht 5\' 2"  (1.575 m)   Wt 164 lb 3.2 oz (74.5  kg)   SpO2 97%   BMI 30.03 kg/m   Neurological Exam: MENTAL STATUS including orientation to time, place, person, recent and remote memory, attention span and concentration, language, and fund of knowledge is normal.  Speech is not dysarthric.  CRANIAL NERVES:  Pupils equal round and reactive to light.  Normal conjugate, extra-ocular eye movements in all directions of gaze.  No ptosis. Reduced hearing on the left.  MOTOR:  Motor strength is 5/5 in all extremities, including intrinsic hand muscles.   MSRs:  Reflexes are 2+/4 throughout.  SENSATION:  Vibration intact  throughout  COORDINATION/GAIT:    Gait narrow based and stable.   Data: MRI brain wwo contrast 12/06/2014:  Similar expansion of the left internal auditory canal with mixed signal intensity material which exhibits heterogeneous enhancement. Findings may reflect scar tissue or residual tumor, however there is no appreciable change in appearance compared to the outside MRI dated 08/28/2012.  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 Lacy enhancement within the left internal auditory canal may represent postop enhancement. As there are no prior studies, follow-up MRI suggested to evaluate for interval changes. Residual or recurrent tumor cannot be excluded based on this single examination.  MRI brain wwo contrast 06/09/2017: 1. History of left IAC resection in 2010. Stable enhancement at the postoperative left internal auditory canal. Wispy pattern and stability favors scarring over residual tumor. Per report, postoperative left IAC enhancement has been noted by MRI since at least 2014. 2. No new finding.  MRI brain wwo contrast 09/13/2018: No change since 2019. No abnormality seen on the right to explain vertigo and hearing loss.  Previous left occipital craniotomy for acoustic neuroma resection.  Postoperative encephalomalacia of the lateral cerebellum on the left. Postoperative enhancement in the left internal auditory canal,  stable over time and more consistent with scarring than residual tumor.  MRI brain wwo contrast 04/18/2020: Stable postoperative posterior fossa and left internal auditory canal. No interval abnormality to explain symptoms.  NCS/EMG of the right upper extremity 07/14/2016: 1. Right median neuropathy at or distal to the wrist consistent with a clinical diagnosis of carpal tunnel syndrome; very mild in degree electrically. 2. Right ulnar neuropathy across the elbow, purely demyelinating in type.   IMPRESSION/PLAN: 1.  Left hand  paresthesias, suspect cubital tunnel syndrome  - Start using soft elbow pad, avoid compression of the nerve at the elbow and hyperflexion  - NCS/EMG of the left arm  2.  Right CTS and cubital tunnel syndrome, stable  - NCS/EMG of the right arm   3.  History of left acoustic neuroma s/p excision (2010) with residual scarring on imaging and left hearing loss.  MRI brain from 04/2020 personally viewed and is stable.  Return to clinic in 6 months   Thank you for allowing me to participate in patient's care.  If I can answer any additional questions, I would be pleased to do so.    Sincerely,    Kadden Osterhout K. Posey Pronto, DO

## 2020-07-08 ENCOUNTER — Encounter: Payer: 59 | Admitting: Neurology

## 2020-11-06 ENCOUNTER — Ambulatory Visit: Payer: 59 | Admitting: Neurology

## 2020-12-17 ENCOUNTER — Other Ambulatory Visit (HOSPITAL_BASED_OUTPATIENT_CLINIC_OR_DEPARTMENT_OTHER): Payer: Self-pay

## 2020-12-17 MED ORDER — INFLUENZA VAC SPLIT QUAD 0.5 ML IM SUSY
PREFILLED_SYRINGE | INTRAMUSCULAR | 0 refills | Status: AC
  Filled 2020-12-17: qty 0.5, 1d supply, fill #0

## 2021-01-13 ENCOUNTER — Telehealth: Payer: Self-pay | Admitting: Neurology

## 2021-01-13 NOTE — Telephone Encounter (Signed)
Pt made a follow up for 12/5. Said she needs another MRI done, it has been a while and she is having a strange feeling in her head.

## 2021-01-13 NOTE — Telephone Encounter (Signed)
Called patient and informed her that it would be best for her to seek evaluation at the ER if she feels something is different or wrong with her head. I informed patient that she would need to wait until her appt on 12/5 to speak with Dr. Posey Pronto in regards to her MRI request so Dr. Posey Pronto can evaluate her. Patient is in agreeable and will go the ER if she feels she is becoming worse or feels she needs to be seen. Patient had no further questions or concerns.

## 2021-01-14 NOTE — Telephone Encounter (Signed)
Noted  

## 2021-01-18 ENCOUNTER — Ambulatory Visit: Payer: 59 | Admitting: Neurology

## 2021-01-26 ENCOUNTER — Encounter: Payer: Self-pay | Admitting: Neurology

## 2021-01-26 ENCOUNTER — Ambulatory Visit: Payer: 59 | Admitting: Neurology

## 2021-01-26 ENCOUNTER — Other Ambulatory Visit: Payer: Self-pay

## 2021-01-26 VITALS — BP 118/82 | HR 101 | Ht 62.0 in | Wt 166.0 lb

## 2021-01-26 DIAGNOSIS — G5601 Carpal tunnel syndrome, right upper limb: Secondary | ICD-10-CM | POA: Diagnosis not present

## 2021-01-26 DIAGNOSIS — G43011 Migraine without aura, intractable, with status migrainosus: Secondary | ICD-10-CM

## 2021-01-26 DIAGNOSIS — D333 Benign neoplasm of cranial nerves: Secondary | ICD-10-CM | POA: Diagnosis not present

## 2021-01-26 MED ORDER — NURTEC 75 MG PO TBDP: 75.0000 mg | ORAL_TABLET | ORAL | 3 refills | Status: AC | PRN

## 2021-01-26 NOTE — Progress Notes (Signed)
Follow-up Visit   Date: 01/26/21    Tina Henson MRN: [id number] DOB: 1975/03/08   Interim History: Tina Henson is a 44 y.o. 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 The patient was accompanied to the clinic by self.  History of present illness: Starting around the Spring of 2018, she develop tingling over the lips, forehead, and bilateral ear achy pain. Tingling of the face is intermittent, sometime lasting 1-2 days occurring once every 2 weeks. She has not identified any triggers for facial tingling.  She denies any double vision, facial weakness, or difficulty/swallowing talking.  Her each pain is exacerbated by loud noises.  It is better with nasal spray which has helped her pain some.    She was last seen by Dr. Redmond Pulling, neurosurgery at [facility], in 2016 for left acoustic neuroma.  She had resection in 2010 while living in [place] and has residual tumor that is followed by serial imaging.  I do not have her MRI brain from 2016 to review personally, but report shows that her imaging was stable.  She has history of throbbing pain over the forehead from before her surgery which occurs about once per week and responds to OTC such as ibuprofen or aspirin. She continues to be bothered by her right ear pain and left tinnitus and hearing loss and would like to see ENT.  In the past, cochlear implant was recommended, but she did not want to pursue this.  She continues to have right arm numbness/tingling, now sometimes in the left hand.  She has NCS of the right arm which showed mild CTS and cubital tunnel syndrome.   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      UPDATE 05/01/2020:  She is here for follow-up and reports worsening bilateral hand paresthesias, worse on the left 4th and 5th  fingers.  She also has some weakness with opening jars/bottles with the left hand.  NCS/EMG of the right arm in 2018 showed very mild right CTS and mild right ulnar neuropathy.  She has been much more careful about hand positioning on the right, which has reduced the intensity and frequency of paresthesias on the right.  Recent MRI brain shows stable changes.   UPDATE 01/26/2021: She scheduled sooner visit due to have having scalp pain and tenderness with light pressure for the past two weeks.  She has associated achy bitemporal pain. She denies nausea/vomiting.  She has some lightheadedness. No falls.  She has history of migraines, which she gets about twice per week, sometimes it tends to cluster and she can get headaches for 1-2 weeks.  She also complains of hand stiffness and bilateral hand tingling. NCS/EMG was ordered at her last visit and she cancelled it.  Medications:  Current Outpatient Medications on File Prior to Visit  Medication Sig Dispense Refill   acetaminophen (TYLENOL) 325 MG tablet Take 650 mg by mouth every 6 (six) hours as needed.     bisacodyl (DULCOLAX) 5 MG EC tablet Take 5 mg by mouth as needed.     influenza vac split quadrivalent PF (FLUARIX) 0.5 ML injection Inject into the muscle. 0.5 mL 0   valACYclovir (VALTREX) 500 MG tablet TAKE 1 TABLET BY MOUTH DAILY FOR SUPPRESSION & INCREASE TO TWICE DAILY FOR 3 DAYS FOR OUTBREAK 15 tablet 0  Acetaminophen-Caffeine (EXCEDRIN TENSION HEADACHE) 500-65 MG TABS 2 tablets as needed (Patient not taking: Reported on 01/26/2021)     meclizine (ANTIVERT) 12.5 MG tablet Take 1 tablet (12.5 mg total) by mouth 2 (two) times daily as needed for dizziness. (Patient not taking: Reported on 05/01/2020) 30 tablet 0   omeprazole (PRILOSEC) 20 MG capsule  (Patient not taking: Reported on 05/01/2020)     No current facility-administered medications on file prior to visit.    Allergies: No Known Allergies  Vital Signs:  BP 118/82    Pulse (!)  101    Ht 5\' 2"  (1.575 m)    Wt 166 lb (75.3 kg)    SpO2 98%    BMI 30.36 kg/m   Neurological Exam: MENTAL STATUS including orientation to time, place, person, recent and remote memory, attention span and concentration, language, and fund of knowledge is normal.  Speech is not dysarthric.  CRANIAL NERVES:  Pupils equal round and reactive to light.  Normal conjugate, extra-ocular eye movements in all directions of gaze.  No ptosis. Reduced hearing on the left. Face is symmetric. Tongue is midline.  MOTOR:  Motor strength is 5/5 in all extremities, including intrinsic hand muscles.   MSRs:  Reflexes are 2+/4 throughout.  SENSATION:  Vibration intact throughout. Rhomberg test is negative  COORDINATION/GAIT:    Gait narrow based and stable. Stressed and tandem gait intact.  Data: MRI brain wwo contrast 12/06/2014:  Similar expansion of the left internal auditory canal with mixed signal intensity material which exhibits heterogeneous enhancement. Findings may reflect scar tissue or residual tumor, however there is no appreciable change in appearance compared to the outside MRI dated 08/28/2012.  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 Lacy enhancement within the left internal auditory canal may represent postop enhancement. As there are no prior studies, follow-up MRI suggested to evaluate for interval changes. Residual or recurrent tumor cannot be excluded based on this single examination.  MRI brain wwo contrast 06/09/2017: 1. History of left IAC resection in 2010. Stable enhancement at the postoperative left internal auditory canal. Wispy pattern and stability favors scarring over residual tumor. Per report, postoperative left IAC enhancement has been noted by MRI since at least 2014. 2. No new finding.  MRI brain wwo contrast 09/13/2018: No change since 2019. No abnormality seen on the right to explain vertigo and hearing loss.   Previous left  occipital craniotomy for acoustic neuroma resection.  Postoperative encephalomalacia of the lateral cerebellum on the left. Postoperative enhancement in the left internal auditory canal,  stable over time and more consistent with scarring than residual tumor.  MRI brain wwo contrast 04/18/2020: Stable postoperative posterior fossa and left internal auditory canal. No interval abnormality to explain symptoms.   NCS/EMG of the right upper extremity 07/14/2016: Right median neuropathy at or distal to the wrist consistent with a clinical diagnosis of carpal tunnel syndrome; very mild in degree electrically. Right ulnar neuropathy across the elbow, purely demyelinating in type.   IMPRESSION/PLAN: Atypical migraine with associated allodynia  - Start Nurtec 75mg  for severe migraine  2.  Bilateral hand paresthesias with knows R CTS and R cubital tunnel syndrome, worsening  - NCS/EMG of the hands offered.  She would like to discuss symptoms of stiffness with PCP and then decide whether to proceed   - Encouraged compliance with using right wrist brace  3.  History of left acoustic neuroma s/p excision (2010) with residual scarring on imaging and left  hearing loss.  MRI brain from 04/2020 personally viewed and is stable.     Thank you for allowing me to participate in patient's care.  If I can answer any additional questions, I would be pleased to do so.    Sincerely,    Emsley Custer K. Posey Pronto, DO

## 2021-01-26 NOTE — Patient Instructions (Addendum)
Start Nurtec 75mg  as needed for severe headache  If you would like to proceed with nerve testing, please let me know

## 2021-05-13 ENCOUNTER — Other Ambulatory Visit: Payer: Self-pay | Admitting: Physician Assistant

## 2021-05-13 DIAGNOSIS — R1013 Epigastric pain: Secondary | ICD-10-CM

## 2021-05-13 DIAGNOSIS — R1011 Right upper quadrant pain: Secondary | ICD-10-CM

## 2021-05-13 DIAGNOSIS — R1084 Generalized abdominal pain: Secondary | ICD-10-CM

## 2021-05-24 ENCOUNTER — Ambulatory Visit
Admission: RE | Admit: 2021-05-24 | Discharge: 2021-05-24 | Disposition: A | Discharge status: Home / Self Care | Payer: 59 | Source: Ambulatory Visit | Attending: Physician Assistant | Admitting: Physician Assistant

## 2021-05-24 DIAGNOSIS — R1011 Right upper quadrant pain: Secondary | ICD-10-CM

## 2021-05-24 DIAGNOSIS — R1084 Generalized abdominal pain: Secondary | ICD-10-CM

## 2021-05-24 DIAGNOSIS — R1013 Epigastric pain: Secondary | ICD-10-CM

## 2021-12-21 ENCOUNTER — Other Ambulatory Visit: Payer: Self-pay | Admitting: Family Medicine

## 2021-12-21 ENCOUNTER — Ambulatory Visit
Admission: RE | Admit: 2021-12-21 | Discharge: 2021-12-21 | Disposition: A | Discharge status: Home / Self Care | Payer: 59 | Source: Ambulatory Visit | Attending: Family Medicine | Admitting: Family Medicine

## 2021-12-21 DIAGNOSIS — Z1231 Encounter for screening mammogram for malignant neoplasm of breast: Secondary | ICD-10-CM

## 2021-12-24 ENCOUNTER — Other Ambulatory Visit: Payer: Self-pay | Admitting: Family Medicine

## 2021-12-24 DIAGNOSIS — R928 Other abnormal and inconclusive findings on diagnostic imaging of breast: Secondary | ICD-10-CM

## 2022-01-03 ENCOUNTER — Ambulatory Visit
Admission: RE | Admit: 2022-01-03 | Discharge: 2022-01-03 | Disposition: A | Discharge status: Home / Self Care | Payer: 59 | Source: Ambulatory Visit | Attending: Family Medicine | Admitting: Family Medicine

## 2022-01-03 ENCOUNTER — Other Ambulatory Visit: Payer: Self-pay | Admitting: Family Medicine

## 2022-01-03 DIAGNOSIS — R928 Other abnormal and inconclusive findings on diagnostic imaging of breast: Secondary | ICD-10-CM

## 2022-07-04 ENCOUNTER — Ambulatory Visit
Admission: RE | Admit: 2022-07-04 | Discharge: 2022-07-04 | Disposition: A | Discharge status: Home / Self Care | Payer: 59 | Source: Ambulatory Visit | Attending: Family Medicine | Admitting: Family Medicine

## 2022-07-04 DIAGNOSIS — R928 Other abnormal and inconclusive findings on diagnostic imaging of breast: Secondary | ICD-10-CM

## 2022-07-12 ENCOUNTER — Other Ambulatory Visit: Payer: Self-pay | Admitting: Family Medicine

## 2022-07-12 DIAGNOSIS — N6489 Other specified disorders of breast: Secondary | ICD-10-CM

## 2022-07-25 ENCOUNTER — Other Ambulatory Visit: Payer: Self-pay

## 2022-08-11 ENCOUNTER — Other Ambulatory Visit: Payer: Self-pay

## 2023-01-05 ENCOUNTER — Ambulatory Visit
Admission: RE | Admit: 2023-01-05 | Discharge: 2023-01-05 | Disposition: A | Discharge status: Home / Self Care | Payer: 59 | Source: Ambulatory Visit | Attending: Family Medicine | Admitting: Family Medicine

## 2023-01-05 DIAGNOSIS — N6489 Other specified disorders of breast: Secondary | ICD-10-CM

## 2023-01-16 ENCOUNTER — Telehealth: Payer: Self-pay | Admitting: Neurology

## 2023-01-16 DIAGNOSIS — D333 Benign neoplasm of cranial nerves: Secondary | ICD-10-CM

## 2023-01-16 NOTE — Telephone Encounter (Signed)
Pt called mail box full MRI has been ordered

## 2023-01-16 NOTE — Telephone Encounter (Signed)
Pt called in stating Dr. Allena Katz usually wants her to get an MRI every 2 years or so and wants to make sure she still wants her to get one?

## 2023-01-16 NOTE — Telephone Encounter (Signed)
We can recheck MRI brain wwo contrast in March 2025.

## 2023-01-17 NOTE — Telephone Encounter (Signed)
Pt called mail box full MRI has been ordered

## 2023-01-17 NOTE — Telephone Encounter (Signed)
Pt was called back

## 2023-04-25 ENCOUNTER — Encounter: Payer: Self-pay | Admitting: Neurology

## 2023-05-03 ENCOUNTER — Ambulatory Visit
Admission: RE | Admit: 2023-05-03 | Discharge: 2023-05-03 | Disposition: A | Discharge status: Home / Self Care | Payer: 59 | Source: Ambulatory Visit | Attending: Neurology | Admitting: Neurology

## 2023-05-03 DIAGNOSIS — D333 Benign neoplasm of cranial nerves: Secondary | ICD-10-CM

## 2023-05-03 MED ORDER — GADOPICLENOL 0.5 MMOL/ML IV SOLN
7.5000 mL | Freq: Once | INTRAVENOUS | Status: AC | PRN
  Administered 2023-05-03: 7.5 mL via INTRAVENOUS

## 2023-05-18 ENCOUNTER — Emergency Department (HOSPITAL_COMMUNITY)

## 2023-05-18 ENCOUNTER — Emergency Department (HOSPITAL_COMMUNITY)
Admission: EM | Admit: 2023-05-18 | Discharge: 2023-05-19 | Disposition: A | Discharge status: Home / Self Care | Attending: Emergency Medicine | Admitting: Emergency Medicine

## 2023-05-18 DIAGNOSIS — D259 Leiomyoma of uterus, unspecified: Secondary | ICD-10-CM | POA: Insufficient documentation

## 2023-05-18 DIAGNOSIS — R1030 Lower abdominal pain, unspecified: Secondary | ICD-10-CM

## 2023-05-18 DIAGNOSIS — R102 Pelvic and perineal pain: Secondary | ICD-10-CM | POA: Diagnosis present

## 2023-05-18 LAB — COMPREHENSIVE METABOLIC PANEL WITH GFR
ALT: 21 U/L (ref 0–44)
AST: 20 U/L (ref 15–41)
Albumin: 4 g/dL (ref 3.5–5.0)
Alkaline Phosphatase: 46 U/L (ref 38–126)
Anion gap: 8 (ref 5–15)
BUN: 13 mg/dL (ref 6–20)
CO2: 25 mmol/L (ref 22–32)
Calcium: 9 mg/dL (ref 8.9–10.3)
Chloride: 104 mmol/L (ref 98–111)
Creatinine, Ser: 0.96 mg/dL (ref 0.44–1.00)
GFR, Estimated: >60 mL/min (ref >60)
Glucose, Bld: 97 mg/dL (ref 70–99)
Potassium: 3.7 mmol/L (ref 3.5–5.1)
Sodium: 137 mmol/L (ref 135–145)
Total Bilirubin: 0.6 mg/dL (ref 0.0–1.2)
Total Protein: 7.1 g/dL (ref 6.5–8.1)

## 2023-05-18 LAB — URINALYSIS, ROUTINE W REFLEX MICROSCOPIC
Bacteria, UA: NONE SEEN
Bilirubin Urine: NEGATIVE
Glucose, UA: NEGATIVE mg/dL
Ketones, ur: NEGATIVE mg/dL
Leukocytes,Ua: NEGATIVE
Nitrite: NEGATIVE
Protein, ur: NEGATIVE mg/dL
Specific Gravity, Urine: 1.002 — ABNORMAL LOW (ref 1.005–1.030)
pH: 6 (ref 5.0–8.0)

## 2023-05-18 LAB — CBC
HCT: 36.7 % (ref 36.0–46.0)
Hemoglobin: 12.3 g/dL (ref 12.0–15.0)
MCH: 30.9 pg (ref 26.0–34.0)
MCHC: 33.5 g/dL (ref 30.0–36.0)
MCV: 92.2 fL (ref 80.0–100.0)
Platelets: 227 10*3/uL (ref 150–400)
RBC: 3.98 MIL/uL (ref 3.87–5.11)
RDW: 14.8 % (ref 11.5–15.5)
WBC: 6.2 10*3/uL (ref 4.0–10.5)
nRBC: 0 % (ref 0.0–0.2)

## 2023-05-18 LAB — HCG, SERUM, QUALITATIVE: Preg, Serum: NEGATIVE

## 2023-05-18 LAB — LIPASE, BLOOD: Lipase: 31 U/L (ref 11–51)

## 2023-05-18 MED ORDER — IOHEXOL 300 MG/ML  SOLN
100.0000 mL | Freq: Once | INTRAMUSCULAR | Status: AC | PRN
  Administered 2023-05-18: 100 mL via INTRAVENOUS

## 2023-05-18 MED ORDER — NAPROXEN 375 MG PO TABS: 375.0000 mg | ORAL_TABLET | Freq: Two times a day (BID) | ORAL | 0 refills | Status: AC

## 2023-05-18 NOTE — ED Provider Notes (Signed)
 [place] EMERGENCY DEPARTMENT AT [facility] Provider Note   CSN: [id number] Arrival date & time: 05/18/23  1733     History  Chief Complaint  Patient presents with   Abdominal Pain   Dysuria    Tina Henson is a 48 y.o. female who presents emergency department with a chief complaint of pelvic pain.  Patient reports she had onset of pelvic pain over the last few days.  Hurts worse when she coughs or sneezes.  She feels some distention in her lower abdomen.  She denies nausea vomiting diarrhea, patient does not mention any urinary symptoms to me.  She does note that just prior to this her breast became swollen and tender just like prior to a menstrual cycle or when she was pregnant.  Patient states "this would have to be the immaculate conception."  States that she could not be pregnant.  She is also complaining of some pain in her legs that has improved.  She denies fevers chills.   Abdominal Pain Associated symptoms: dysuria   Dysuria Associated symptoms: abdominal pain        Home Medications Prior to Admission medications   Medication Sig Start Date End Date Taking? Authorizing Provider  naproxen (NAPROSYN) 375 MG tablet Take 1 tablet (375 mg total) by mouth 2 (two) times daily with a meal. 05/18/23  Yes Bekka Qian, PA-C  acetaminophen (TYLENOL) 325 MG tablet Take 650 mg by mouth every 6 (six) hours as needed.    [provider]  Acetaminophen-Caffeine (EXCEDRIN TENSION HEADACHE) 500-65 MG TABS 2 tablets as needed Patient not taking: Reported on 01/26/2021    [provider]  bisacodyl (DULCOLAX) 5 MG EC tablet Take 5 mg by mouth as needed. 06/13/16   [provider]  influenza vac split quadrivalent PF (FLUARIX) 0.5 ML injection Inject into the muscle. 12/17/20   Liane Redman, MD  meclizine (ANTIVERT) 12.5 MG tablet Take 1 tablet (12.5 mg total) by mouth 2 (two) times daily as needed for dizziness. Patient not taking:  Reported on 05/01/2020 06/01/18   Patel, Donika K, DO  omeprazole (PRILOSEC) 20 MG capsule  12/28/17   [provider]  Rimegepant Sulfate (NURTEC) 75 MG TBDP Take 75 mg by mouth as needed (Limit to twice per week). 01/26/21   Patel, Donika K, DO  valACYclovir (VALTREX) 500 MG tablet TAKE 1 TABLET BY MOUTH DAILY FOR SUPPRESSION & INCREASE TO TWICE DAILY FOR 3 DAYS FOR OUTBREAK 08/28/17   Emilie Harden, Clance Crigler, PA-C      Allergies    Patient has no known allergies.    Review of Systems   Review of Systems  Gastrointestinal:  Positive for abdominal pain.  Genitourinary:  Positive for dysuria.    Physical Exam Updated Vital Signs BP (!) 130/95   Pulse (!) 107   Temp 99.3 F (37.4 C)   Resp 18   SpO2 94%  Physical Exam Vitals and nursing note reviewed.  Constitutional:      General: She is not in acute distress.    Appearance: She is well-developed. She is not diaphoretic.  HENT:     Head: Normocephalic and atraumatic.     Right Ear: External ear normal.     Left Ear: External ear normal.     Nose: Nose normal.     Mouth/Throat:     Mouth: Mucous membranes are moist.  Eyes:     General: No scleral icterus.    Conjunctiva/sclera: Conjunctivae normal.  Cardiovascular:  Rate and Rhythm: Normal rate and regular rhythm.     Heart sounds: Normal heart sounds. No murmur heard.    No friction rub. No gallop.  Pulmonary:     Effort: Pulmonary effort is normal. No respiratory distress.     Breath sounds: Normal breath sounds.  Chest:  Breasts:    Right: Normal. No bleeding, mass, nipple discharge or skin change.     Left: Normal. No bleeding, mass, nipple discharge or skin change.  Abdominal:     General: Bowel sounds are normal. There is no distension.     Palpations: Abdomen is soft. There is no mass.     Tenderness: There is abdominal tenderness in the right upper quadrant and right lower quadrant. There is no guarding or rebound.  Musculoskeletal:     Cervical back:  Normal range of motion.  Skin:    General: Skin is warm and dry.  Neurological:     Mental Status: She is alert and oriented to person, place, and time.  Psychiatric:        Behavior: Behavior normal.     ED Results / Procedures / Treatments   Labs (all labs ordered are listed, but only abnormal results are displayed) Labs Reviewed  URINALYSIS, ROUTINE W REFLEX MICROSCOPIC - Abnormal; Notable for the following components:      Result Value   Color, Urine STRAW (*)    Specific Gravity, Urine 1.002 (*)    Hgb urine dipstick SMALL (*)    All other components within normal limits  LIPASE, BLOOD  COMPREHENSIVE METABOLIC PANEL WITH GFR  CBC  HCG, SERUM, QUALITATIVE    EKG None  Radiology CT ABDOMEN PELVIS W CONTRAST Result Date: 05/18/2023 CLINICAL DATA:  Right lower quadrant abdominal pain, dysuria EXAM: CT ABDOMEN AND PELVIS WITH CONTRAST TECHNIQUE: Multidetector CT imaging of the abdomen and pelvis was performed using the standard protocol following bolus administration of intravenous contrast. RADIATION DOSE REDUCTION: This exam was performed according to the departmental dose-optimization program which includes automated exposure control, adjustment of the mA and/or kV according to patient size and/or use of iterative reconstruction technique. CONTRAST:  OMNIPAQUE IOHEXOL 300 MG/ML  SOLN COMPARISON:  01/03/2015 FINDINGS: Lower chest: No acute pleural or parenchymal lung disease. Hepatobiliary: Left lobe hepatic cysts. Calcified granulomata are seen within the liver. No biliary duct dilation. The gallbladder is decompressed. No evidence of cholelithiasis or cholecystitis. Pancreas: Unremarkable. No pancreatic ductal dilatation or surrounding inflammatory changes. Spleen: Normal in size without focal abnormality. Adrenals/Urinary Tract: Adrenal glands are unremarkable. Kidneys are normal, without renal calculi, focal lesion, or hydronephrosis. Bladder is unremarkable. Stomach/Bowel:  No bowel obstruction or ileus. Normal appendix right lower quadrant. No bowel wall thickening or inflammatory change. Vascular/Lymphatic: No significant vascular findings are present. No enlarged abdominal or pelvic lymph nodes. Reproductive: Multiple fibroids are seen within the uterus. There are no adnexal masses. Other: No free fluid or free intraperitoneal gas. Small fat containing umbilical hernia. No bowel herniation. Musculoskeletal: No acute or destructive bony abnormalities. Reconstructed images demonstrate no additional findings. IMPRESSION: 1. No acute intra-abdominal or intrapelvic process. Normal appendix. 2. Fibroid uterus. 3. Small fat containing umbilical hernia. Electronically Signed   By: Bobbye Burrow M.D.   On: 05/18/2023 20:58    Procedures Procedures    Medications Ordered in ED Medications  iohexol (OMNIPAQUE) 300 MG/ML solution 100 mL (100 mLs Intravenous Contrast Given 05/18/23 2033)    ED Course/ Medical Decision Making/ A&P Clinical Course as of 05/18/23 2308  Thu May 18, 2023  2257 Urinalysis, Routine w reflex microscopic -Urine, Clean Catch(!) Urine appears negative [AH]  2257 hCG, serum, qualitative Negative pregnancy [AH]  2257 Comprehensive metabolic panel CMP within normal limits [AH]  2257 Lipase, blood [AH]  2257 CBC Patient CBC within normal limits [AH]  2257 CT ABDOMEN PELVIS W CONTRAST I visualized interpreted CT abdomen and pelvis which showed no acute findings. [AH]    Clinical Course User Index [AH] Tama Fails, PA-C                                 Medical Decision Making Amount and/or Complexity of Data Reviewed Labs: ordered. Decision-making details documented in ED Course. Radiology: ordered. Decision-making details documented in ED Course.  Risk Prescription drug management.   This patient presents to the ED for concern of RLQ abd pain , this involves an extensive number of treatment options, and is a complaint that carries with  it a high risk of complications and morbidity.    Co morbidities:  . has a past medical history of Hearing loss, Heart murmur, Herpes, S/P excision of acoustic neuroma (2010), and Sinusitis.   Social Determinants of Health:   SDOH Screenings   Tobacco Use: Low Risk  (01/05/2023)     Lab Tests:  I Ordered, and personally interpreted labs.  The pertinent results include:    No pertinent lab findings  Imaging Studies:  I ordered imaging studies including CT abs/ pv I independently visualized and interpreted imaging which showed no acute findings I agree with the radiologist interpretation  Test Considered:  Pelvic  US  - currently pending  Critical Interventions:    Consultations Obtained:   Problem List / ED Course:     ICD-10-CM   1. Lower abdominal pain  R10.30     2. Uterine leiomyoma, unspecified location  D25.9       MDM: patietn with pevic pain- suspecty fibroids. US  pending- r/o torsion. Sign out to PA Ceres at shift change             Final Clinical Impression(s) / ED Diagnoses Final diagnoses:  Lower abdominal pain  Uterine leiomyoma, unspecified location    Rx / DC Orders ED Discharge Orders          Ordered    naproxen (NAPROSYN) 375 MG tablet  2 times daily with meals        05/18/23 2126              Tama Fails, PA-C 05/22/23 2332    Arvilla Birmingham, MD 05/31/23 2006

## 2023-05-18 NOTE — ED Notes (Signed)
 Patient transported to Ultrasound

## 2023-05-18 NOTE — ED Triage Notes (Signed)
 Pt reports that she's been having worsening abd pain, painful urination, urinary frequency, and paresthesias to her toes. Pt states she was "pre-diabetic". Denies N/V/D.

## 2023-05-18 NOTE — ED Provider Notes (Signed)
 Received patient at signout from previous provider pending TVUS.  See her note.  In short, patient presents emergency department for evaluation of worsening right lower abdominal pain, painful breast bilaterally, and paresthesia to her toes. She reports she is perimenopausal.  PE TTP to RLE.  ED workup notable for negative hCG, noninfectious small hemoglobin urine.  CBC, lipase, and CMP WNL.  CT notable for fibroid uterus and small fat-containing umbilical hernia  Pending TVUS.

## 2023-05-18 NOTE — Discharge Instructions (Signed)
 Contact a health care provider if: You have pain in your pelvis or back. Or cramps in your belly that do not get better with medicine or heat. You get new bleeding between your periods. You have worse bleeding during your period, or between them. You feel weaker or more tired than usual. You feel light-headed. Get help right away if: You faint. You have pain in the pelvis that suddenly gets worse. You have very bad bleeding from the vagina that soaks a tampon or pad in 30 minutes or less.

## 2023-05-19 ENCOUNTER — Telehealth: Payer: Self-pay | Admitting: Neurology

## 2023-05-19 LAB — WET PREP, GENITAL
Clue Cells Wet Prep HPF POC: NONE SEEN
Sperm: NONE SEEN
Trich, Wet Prep: NONE SEEN
WBC, Wet Prep HPF POC: <10 (ref <10)
Yeast Wet Prep HPF POC: NONE SEEN

## 2023-05-19 LAB — GC/CHLAMYDIA PROBE AMP (~~LOC~~) NOT AT ARMC
Chlamydia: NEGATIVE
Comment: NEGATIVE
Comment: NORMAL
Neisseria Gonorrhea: NEGATIVE

## 2023-05-19 NOTE — Telephone Encounter (Signed)
 Patient advised the Reading room is working hard to get images read in a timely manner.  We will call the reading room to advised it needs to read.   Once reviewed we will call the patient with the results.

## 2023-05-19 NOTE — Telephone Encounter (Signed)
 Pt called for results.

## 2023-05-22 ENCOUNTER — Telehealth: Payer: Self-pay | Admitting: Neurology

## 2023-05-22 DIAGNOSIS — D333 Benign neoplasm of cranial nerves: Secondary | ICD-10-CM

## 2023-05-22 NOTE — Telephone Encounter (Signed)
 Please let pt know that MRI shows that there is more enhancement seen at the area of her prior acoustic neuroma.  I would like for her to follow-up with neurosurgery.  She had seen Dr. Andrey Campanile at [facility], would she like [place] to send the referral there?  She would need to request imaging on CD to take to her visit.

## 2023-05-22 NOTE — Telephone Encounter (Signed)
 Called patient and informed her of results and recommendations per Dr. Allena Katz. Patient is going to call Dr. Tawana Scale office to see if she is in network and she will contact us back to let us know if she wants a referral to Dr. Andrey Campanile or to [facility] Neurosurgery.  Patient verbalized understanding of all explained to her and had no further questions or concerns.

## 2023-05-22 NOTE — Telephone Encounter (Signed)
 Pt called back in asking for Mahina. She stated she wants her referral sent to Dr. Leanord Asal [facility]. Need MRI's from last 2 years and notes.

## 2023-05-22 NOTE — Telephone Encounter (Signed)
 Referral placed and will fax to Dr. Andrey Campanile.

## 2023-06-08 ENCOUNTER — Other Ambulatory Visit: Payer: Self-pay | Admitting: Family Medicine

## 2023-06-08 DIAGNOSIS — N644 Mastodynia: Secondary | ICD-10-CM

## 2023-06-12 NOTE — Telephone Encounter (Signed)
 All visit notes and MRI's in the last 3 years have been faxed multiple times again in office as requested.

## 2023-06-13 NOTE — Telephone Encounter (Signed)
 Called Tina Henson from [facility] Dr. Ruddy Corral office and asked if he has received the multiple imaging results we have faxed over a few times. Tina Henson stated yes and what they are actually needing is the DISC of the the MRI's for patient.   Patient was advised on 05/22/23 that she would need to take her imaging on a CD to her new patient appointment.

## 2023-06-14 NOTE — Telephone Encounter (Signed)
 [facility] and asked for patients imaging to be pushed through power share. Was informed that Dr. Ruddy Corral office will need to send a request through power share or if they can canopy pax or intellirad they may see MRI images.    Called and spoke to Maggie at [place] and informed her of above. Maggie informed me that she has sent request on 4/11,4/15, 4/17, and 4/21 and still has not received images. Maggie stated that she will send another request as stat 4/30. I informed Truett Gab that I will call them back and let them know that she has sent the request and see what I can do to help.  Maggie will let me know if she does receive anything and I will update her as soon as I know anything as well.    [facility] back and informed them that we are trying to get patients MRI's pushed through and request has been sent by [facility]. Trevor Fudge stated she will get all MRI's pushed through for [facility].

## 2023-09-18 ENCOUNTER — Other Ambulatory Visit: Payer: Self-pay

## 2024-01-01 ENCOUNTER — Other Ambulatory Visit: Payer: Self-pay | Admitting: Family Medicine

## 2024-01-01 DIAGNOSIS — Z1231 Encounter for screening mammogram for malignant neoplasm of breast: Secondary | ICD-10-CM

## 2024-01-30 ENCOUNTER — Ambulatory Visit
Admission: RE | Admit: 2024-01-30 | Discharge: 2024-01-30 | Disposition: A | Source: Ambulatory Visit | Attending: Family Medicine | Admitting: Family Medicine

## 2024-01-30 DIAGNOSIS — Z1231 Encounter for screening mammogram for malignant neoplasm of breast: Secondary | ICD-10-CM
